# Patient Record
Sex: Male | Born: 1965
Health system: Southern US, Community
[De-identification: ages and names within clinical notes are randomized; demographics above are authoritative.]

## PROBLEM LIST (undated history)

## (undated) DIAGNOSIS — I2510 Atherosclerotic heart disease of native coronary artery without angina pectoris: Secondary | ICD-10-CM

## (undated) DIAGNOSIS — I509 Heart failure, unspecified: Secondary | ICD-10-CM

## (undated) DIAGNOSIS — I10 Essential (primary) hypertension: Secondary | ICD-10-CM

## (undated) DIAGNOSIS — E785 Hyperlipidemia, unspecified: Secondary | ICD-10-CM

## (undated) HISTORY — DX: Heart failure, unspecified: I50.9

## (undated) HISTORY — DX: Hyperlipidemia, unspecified: E78.5

## (undated) HISTORY — DX: Essential (primary) hypertension: I10

## (undated) HISTORY — PX: CYST EXCISION: SHX5701

## (undated) HISTORY — PX: MIDDLE EAR SURGERY: SHX713

---

## 2003-12-18 ENCOUNTER — Ambulatory Visit: Payer: Self-pay | Admitting: Cardiology

## 2004-11-21 ENCOUNTER — Ambulatory Visit: Payer: Self-pay | Admitting: Cardiology

## 2007-01-08 ENCOUNTER — Encounter (INDEPENDENT_AMBULATORY_CARE_PROVIDER_SITE_OTHER): Payer: Self-pay | Admitting: General Surgery

## 2007-01-08 ENCOUNTER — Ambulatory Visit (HOSPITAL_COMMUNITY): Admission: RE | Admit: 2007-01-08 | Discharge: 2007-01-08 | Payer: Self-pay | Admitting: General Surgery

## 2010-06-18 NOTE — Op Note (Signed)
NAMELAFE, CLERK NO.:  [id number]   MEDICAL RECORD NO.:  [id number]          PATIENT TYPE:  AMB   LOCATION:  SDS                          FACILITY:  [facility]   PHYSICIAN:  Gabrielle Dare. Janee Morn, M.D.DATE OF BIRTH:  03-19-1965   DATE OF PROCEDURE:  01/08/2007  DATE OF DISCHARGE:                               OPERATIVE REPORT   PREOPERATIVE DIAGNOSIS:  Mass, left posterior thigh.   POSTOPERATIVE DIAGNOSIS:  Mass, left posterior thigh.   PROCEDURE:  Excision of mass, left posterior thigh.   SURGEON:  Gabrielle Dare. Janee Morn, M.D.   ANESTHESIA:  General.   HISTORY OF PRESENT ILLNESS:  Mr. Devonshire is a 45 year old gentleman who  I evaluated in the office in regards to a mass high on his left  posterior thigh.  It initially had an infected appearance consistent  with an epidermal inclusion cyst, however, it is quite large and  pedunculated so excision was planned.  He completed a course of  antibiotics and all the drainage has stopped, but it is still  uncomfortable.  He presents today for elective excision.   PROCEDURE IN DETAIL:  Informed consent was obtained.  The patient was  identified in the preoperative holding area.  His site was marked.  He  was brought to the operating room.  He received intravenous antibiotics.  He was placed in the lithotomy position.  The area around the mass and  his perineum was prepped and draped in a sterile fashion.  Marcaine  0.25% with epinephrine was injected around the base of the mass.  An  elliptical incision was made to encompass the entire base in case this  mass is of a malignant nature.  Subcutaneous tissues were dissected down  and the mass was removed in its entirety and sent to pathology.  Hemostasis was obtained using Bovie cautery.  Some small flaps were  raised medially and laterally to facilitate closure.  The wound was  copiously irrigated.  The wound was then closed with subcutaneous  tissues approximated with  interrupted 3-0 Vicryl sutures.  The skin was  closed with running 4-0 Monocryl subcuticular stitch with no significant  tension and then Dermabond was applied to complete the closure.  Sponge,  needle, and instrument counts were correct.  The patient tolerated the  procedure well without apparent complication, was taken to the recovery  room in stable condition.      Gabrielle Dare Janee Morn, M.D.  Electronically Signed     BET/MEDQ  D:  01/08/2007  T:  01/08/2007  Job:  [id number]   cc:   Ernestina Penna, M.D.

## 2010-11-11 LAB — BASIC METABOLIC PANEL
BUN: 14
CO2: 27
Calcium: 10
Creatinine, Ser: 1.26
GFR calc Af Amer: >60
GFR calc non Af Amer: >60
Glucose, Bld: 103 — ABNORMAL HIGH
Sodium: 135

## 2010-11-11 LAB — CBC
Hemoglobin: 16.3
MCHC: 34.4
Platelets: 246
RBC: 5.21
RDW: 12.2

## 2012-05-06 ENCOUNTER — Other Ambulatory Visit: Payer: Self-pay | Admitting: *Deleted

## 2012-05-06 MED ORDER — HYDROCHLOROTHIAZIDE 25 MG PO TABS: 25.0000 mg | ORAL_TABLET | Freq: Every day | ORAL | Status: DC

## 2012-06-08 ENCOUNTER — Other Ambulatory Visit: Payer: Self-pay

## 2012-06-08 NOTE — Telephone Encounter (Signed)
Last seen 7/13   Last lipid 11/12

## 2012-06-17 ENCOUNTER — Telehealth: Payer: Self-pay | Admitting: Nurse Practitioner

## 2012-06-18 ENCOUNTER — Telehealth: Payer: Self-pay | Admitting: *Deleted

## 2012-06-18 NOTE — Telephone Encounter (Signed)
Pt aware meds were denied and that he must call to get an appt with wong for further rfs- on his VM

## 2012-06-18 NOTE — Telephone Encounter (Signed)
meds were refused on 06/08/12 by [facility], pt needs to be notified

## 2012-06-21 ENCOUNTER — Telehealth: Payer: Self-pay | Admitting: Family Medicine

## 2012-06-21 NOTE — Telephone Encounter (Signed)
jamie B. Took care of this 06/18/12

## 2012-06-21 NOTE — Telephone Encounter (Signed)
APT MADE 

## 2012-06-22 ENCOUNTER — Ambulatory Visit (INDEPENDENT_AMBULATORY_CARE_PROVIDER_SITE_OTHER): Payer: BC Managed Care – PPO | Admitting: Nurse Practitioner

## 2012-06-22 ENCOUNTER — Encounter: Payer: Self-pay | Admitting: Nurse Practitioner

## 2012-06-22 VITALS — BP 158/103 | HR 79 | Temp 98.2°F | Ht 75.0 in | Wt 284.0 lb

## 2012-06-22 DIAGNOSIS — Z125 Encounter for screening for malignant neoplasm of prostate: Secondary | ICD-10-CM

## 2012-06-22 DIAGNOSIS — I10 Essential (primary) hypertension: Secondary | ICD-10-CM

## 2012-06-22 DIAGNOSIS — E785 Hyperlipidemia, unspecified: Secondary | ICD-10-CM | POA: Insufficient documentation

## 2012-06-22 LAB — PSA: PSA: 0.63 ng/mL (ref <=4.00)

## 2012-06-22 LAB — COMPLETE METABOLIC PANEL WITH GFR
ALT: 31 U/L (ref 0–53)
AST: 21 U/L (ref 0–37)
BUN: 16 mg/dL (ref 6–23)
Creat: 1.14 mg/dL (ref 0.50–1.35)
GFR, Est Non African American: 76 mL/min
Total Bilirubin: 0.6 mg/dL (ref 0.3–1.2)

## 2012-06-22 MED ORDER — SIMVASTATIN 40 MG PO TABS: 40.0000 mg | ORAL_TABLET | Freq: Every day | ORAL | Status: DC

## 2012-06-22 MED ORDER — HYDROCHLOROTHIAZIDE 25 MG PO TABS: 25.0000 mg | ORAL_TABLET | Freq: Every day | ORAL | Status: DC

## 2012-06-22 MED ORDER — RAMIPRIL 10 MG PO CAPS: 10.0000 mg | ORAL_CAPSULE | Freq: Every day | ORAL | Status: DC

## 2012-06-22 NOTE — Patient Instructions (Signed)
Health Maintenance, Males A healthy lifestyle and preventative care can promote health and wellness.  Maintain regular health, dental, and eye exams.  Eat a healthy diet. Foods like vegetables, fruits, whole grains, low-fat dairy products, and lean protein foods contain the nutrients you need without too many calories. Decrease your intake of foods high in solid fats, added sugars, and salt. Get information about a proper diet from your caregiver, if necessary.  Regular physical exercise is one of the most important things you can do for your health. Most adults should get at least 150 minutes of moderate-intensity exercise (any activity that increases your heart rate and causes you to sweat) each week. In addition, most adults need muscle-strengthening exercises on 2 or more days a week.   Maintain a healthy weight. The body mass index (BMI) is a screening tool to identify possible weight problems. It provides an estimate of body fat based on height and weight. Your caregiver can help determine your BMI, and can help you achieve or maintain a healthy weight. For adults 20 years and older:  A BMI below 18.5 is considered underweight.  A BMI of 18.5 to 24.9 is normal.  A BMI of 25 to 29.9 is considered overweight.  A BMI of 30 and above is considered obese.  Maintain normal blood lipids and cholesterol by exercising and minimizing your intake of saturated fat. Eat a balanced diet with plenty of fruits and vegetables. Blood tests for lipids and cholesterol should begin at age 20 and be repeated every 5 years. If your lipid or cholesterol levels are high, you are over 50, or you are a high risk for heart disease, you may need your cholesterol levels checked more frequently.Ongoing high lipid and cholesterol levels should be treated with medicines, if diet and exercise are not effective.  If you smoke, find out from your caregiver how to quit. If you do not use tobacco, do not start.  If you  choose to drink alcohol, do not exceed 2 drinks per day. One drink is considered to be 12 ounces (355 mL) of beer, 5 ounces (148 mL) of wine, or 1.5 ounces (44 mL) of liquor.  Avoid use of street drugs. Do not share needles with anyone. Ask for help if you need support or instructions about stopping the use of drugs.  High blood pressure causes heart disease and increases the risk of stroke. Blood pressure should be checked at least every 1 to 2 years. Ongoing high blood pressure should be treated with medicines if weight loss and exercise are not effective.  If you are 45 to 47 years old, ask your caregiver if you should take aspirin to prevent heart disease.  Diabetes screening involves taking a blood sample to check your fasting blood sugar level. This should be done once every 3 years, after age 45, if you are within normal weight and without risk factors for diabetes. Testing should be considered at a younger age or be carried out more frequently if you are overweight and have at least 1 risk factor for diabetes.  Colorectal cancer can be detected and often prevented. Most routine colorectal cancer screening begins at the age of 50 and continues through age 75. However, your caregiver may recommend screening at an earlier age if you have risk factors for colon cancer. On a yearly basis, your caregiver may provide home test kits to check for hidden blood in the stool. Use of a small camera at the end of a tube,   to directly examine the colon (sigmoidoscopy or colonoscopy), can detect the earliest forms of colorectal cancer. Talk to your caregiver about this at age 50, when routine screening begins. Direct examination of the colon should be repeated every 5 to 10 years through age 75, unless early forms of pre-cancerous polyps or small growths are found.  Hepatitis C blood testing is recommended for all people born from 1945 through 1965 and any individual with known risks for hepatitis C.  Healthy  men should no longer receive prostate-specific antigen (PSA) blood tests as part of routine cancer screening. Consult with your caregiver about prostate cancer screening.  Testicular cancer screening is not recommended for adolescents or adult males who have no symptoms. Screening includes self-exam, caregiver exam, and other screening tests. Consult with your caregiver about any symptoms you have or any concerns you have about testicular cancer.  Practice safe sex. Use condoms and avoid high-risk sexual practices to reduce the spread of sexually transmitted infections (STIs).  Use sunscreen with a sun protection factor (SPF) of 30 or greater. Apply sunscreen liberally and repeatedly throughout the day. You should seek shade when your shadow is shorter than you. Protect yourself by wearing long sleeves, pants, a wide-brimmed hat, and sunglasses year round, whenever you are outdoors.  Notify your caregiver of new moles or changes in moles, especially if there is a change in shape or color. Also notify your caregiver if a mole is larger than the size of a pencil eraser.  A one-time screening for abdominal aortic aneurysm (AAA) and surgical repair of large AAAs by sound wave imaging (ultrasonography) is recommended for ages 65 to 75 years who are current or former smokers.  Stay current with your immunizations. Document Released: 07/19/2007 Document Revised: 04/14/2011 Document Reviewed: 06/17/2010 Exit[employer] Patient Information 2013 [employer].  

## 2012-06-22 NOTE — Progress Notes (Signed)
  Subjective:    Patient ID: Jonathan Barrett, male    DOB: 05-May-1965, 47 y.o.   MRN: [id number]  Hypertension This is a chronic problem. The current episode started more than 1 year ago. The problem is unchanged. The problem is uncontrolled (patient has been out of blood pressure meds for 2 weeks.). Associated symptoms include headaches. Pertinent negatives include no chest pain, neck pain, palpitations, peripheral edema or shortness of breath. There are no associated agents to hypertension. Risk factors for coronary artery disease include dyslipidemia, obesity and post-menopausal state. Past treatments include ACE inhibitors and diuretics. The current treatment provides significant improvement. Compliance problems include diet and exercise.   Hyperlipidemia This is a chronic problem. The current episode started more than 1 year ago. The problem is uncontrolled. Recent lipid tests were reviewed and are high. Exacerbating diseases include obesity. There are no known factors aggravating his hyperlipidemia. Pertinent negatives include no chest pain, leg pain, myalgias or shortness of breath. Current antihyperlipidemic treatment includes statins. The current treatment provides moderate improvement of lipids. Compliance problems include adherence to diet and adherence to exercise.  Risk factors for coronary artery disease include hypertension, obesity and male sex.      Review of Systems  Constitutional: Positive for fatigue.  HENT: Negative for neck pain.   Respiratory: Negative for chest tightness and shortness of breath.   Cardiovascular: Negative for chest pain and palpitations.  Musculoskeletal: Negative for myalgias.  Neurological: Positive for headaches.  All other systems reviewed and are negative.       Objective:   Physical Exam  Vitals reviewed. Constitutional: He is oriented to person, place, and time. He appears well-developed and well-nourished.  HENT:  Head: Normocephalic.  Right  Ear: External ear normal.  Left Ear: External ear normal.  Nose: Nose normal.  Mouth/Throat: Oropharynx is clear and moist.  Eyes: EOM are normal. Pupils are equal, round, and reactive to light.  Neck: Normal range of motion. Neck supple. No thyromegaly present.  Cardiovascular: Normal rate, regular rhythm, normal heart sounds and intact distal pulses.   No murmur heard. Pulmonary/Chest: Effort normal and breath sounds normal. He has no wheezes. He has no rales.  Abdominal: Soft. Bowel sounds are normal.  Musculoskeletal: Normal range of motion.  Neurological: He is alert and oriented to person, place, and time.  Skin: Skin is warm and dry.  Psychiatric: He has a normal mood and affect. His behavior is normal. Judgment and thought content normal.  BP 158/103  Pulse 79  Temp(Src) 98.2 F (36.8 C) (Oral)  Ht 6\' 3"  (1.905 m)  Wt 284 lb (128.822 kg)  BMI 35.5 kg/m2         Assessment & Plan:  1. Hypertension Low NA+ diet - hydrochlorothiazide (HYDRODIURIL) 25 MG tablet; Take 1 tablet (25 mg total) by mouth daily.  Dispense: 30 tablet; Refill: 5 - ramipril (ALTACE) 10 MG capsule; Take 1 capsule (10 mg total) by mouth daily.  Dispense: 30 capsule; Refill: 5 - COMPLETE METABOLIC PANEL WITH GFR  2. Hyperlipidemia Low fat diet and exercise - simvastatin (ZOCOR) 40 MG tablet; Take 1 tablet (40 mg total) by mouth at bedtime.  Dispense: 30 tablet; Refill: 5 - NMR Lipoprofile with Lipids  3. Screening for prostate cancer - PSA  Mary-Margaret Daphine Deutscher, FNP

## 2012-06-25 LAB — NMR LIPOPROFILE WITH LIPIDS
HDL Size: 8.6 nm — ABNORMAL LOW (ref >=9.2)
LDL Particle Number: 2010 nmol/L — ABNORMAL HIGH (ref <1000)
Large VLDL-P: 7 nmol/L — ABNORMAL HIGH (ref <=2.7)

## 2012-06-29 ENCOUNTER — Other Ambulatory Visit: Payer: Self-pay | Admitting: Nurse Practitioner

## 2012-06-29 MED ORDER — ATORVASTATIN CALCIUM 40 MG PO TABS: 40.0000 mg | ORAL_TABLET | Freq: Every day | ORAL | Status: DC

## 2012-06-30 ENCOUNTER — Telehealth: Payer: Self-pay | Admitting: Nurse Practitioner

## 2012-06-30 MED ORDER — RAMIPRIL 10 MG PO CAPS: ORAL_CAPSULE | ORAL | Status: DC

## 2012-06-30 NOTE — Telephone Encounter (Signed)
Pt takes 2 a day of altace for a total of 20mg  a da. Can we send in new rx. And pt aware of labs

## 2012-06-30 NOTE — Telephone Encounter (Signed)
rx sent to pharmacy

## 2012-06-30 NOTE — Telephone Encounter (Signed)
rx sent in- patient aware.  

## 2012-07-06 ENCOUNTER — Ambulatory Visit: Payer: Self-pay | Admitting: General Practice

## 2012-07-12 ENCOUNTER — Ambulatory Visit (INDEPENDENT_AMBULATORY_CARE_PROVIDER_SITE_OTHER): Payer: BC Managed Care – PPO | Admitting: General Practice

## 2012-07-12 ENCOUNTER — Telehealth: Payer: Self-pay | Admitting: Nurse Practitioner

## 2012-07-12 ENCOUNTER — Encounter: Payer: Self-pay | Admitting: General Practice

## 2012-07-12 VITALS — BP 122/86 | HR 107 | Temp 99.3°F | Ht 73.0 in | Wt 280.5 lb

## 2012-07-12 DIAGNOSIS — J029 Acute pharyngitis, unspecified: Secondary | ICD-10-CM

## 2012-07-12 MED ORDER — AMOXICILLIN 500 MG PO CAPS: 500.0000 mg | ORAL_CAPSULE | Freq: Two times a day (BID) | ORAL | Status: DC

## 2012-07-12 NOTE — Patient Instructions (Addendum)

## 2012-07-12 NOTE — Progress Notes (Signed)
  Subjective:    Patient ID: Jonathan Barrett, male    DOB: 1965/05/16, 47 y.o.   MRN: [id number]  HPI Presents today with complaints of sore throat. Reports onset as Saturday. OTC tylenol and ibuprofen for fever 9100.5-102). Rates sore throat as 6-7 on 1-10 pain scale.     Review of Systems  Constitutional: Positive for fever. Negative for chills.  HENT: Positive for sore throat. Negative for ear pain, congestion, sneezing, neck pain, neck stiffness, postnasal drip and sinus pressure.   Eyes: Negative for pain, discharge and itching.  Respiratory: Negative for cough, chest tightness and wheezing.   Cardiovascular: Negative for chest pain and palpitations.  Gastrointestinal: Negative for nausea, vomiting, abdominal pain, diarrhea and blood in stool.  Genitourinary: Negative for difficulty urinating.  Musculoskeletal: Positive for myalgias.  Skin: Negative for rash.  Neurological: Negative for dizziness, syncope, weakness and headaches.  All other systems reviewed and are negative.       Objective:   Physical Exam  Constitutional: He is oriented to person, place, and time. He appears well-developed and well-nourished.  HENT:  Head: Normocephalic and atraumatic.  Right Ear: External ear normal.  Left Ear: External ear normal.  Mouth/Throat: Posterior oropharyngeal erythema present.  Cardiovascular: Normal rate, regular rhythm and normal heart sounds.   Pulmonary/Chest: Effort normal and breath sounds normal. No respiratory distress. He exhibits no tenderness.  Neurological: He is alert and oriented to person, place, and time.  Skin: Skin is warm and dry.  Psychiatric: He has a normal mood and affect.          Assessment & Plan:  1. Sore throat - POCT rapid strep A  2. Acute pharyngitis - amoxicillin (AMOXIL) 500 MG capsule; Take 1 capsule (500 mg total) by mouth 2 (two) times daily.  Dispense: 20 capsule; Refill: 0 -complete full dose of antibiotics Increase fluid  intake Motrin or tylenol OTC OTC decongestant Throat lozenges if help New toothbrush in 3 days Proper handwashing RTO if symptoms worsen or unresolved Patient verbalized understanding Coralie Keens, FNP-C

## 2012-07-12 NOTE — Telephone Encounter (Signed)
Apt made

## 2012-12-20 ENCOUNTER — Other Ambulatory Visit: Payer: Self-pay | Admitting: *Deleted

## 2012-12-20 DIAGNOSIS — I10 Essential (primary) hypertension: Secondary | ICD-10-CM

## 2012-12-20 MED ORDER — HYDROCHLOROTHIAZIDE 25 MG PO TABS: 25.0000 mg | ORAL_TABLET | Freq: Every day | ORAL | Status: DC

## 2012-12-20 NOTE — Telephone Encounter (Signed)
ntbs

## 2013-01-17 ENCOUNTER — Other Ambulatory Visit: Payer: Self-pay

## 2013-01-17 NOTE — Telephone Encounter (Signed)
Last seen 07/12/12  [facility]  Pharmacy requesting a 90 day supply

## 2013-01-17 NOTE — Telephone Encounter (Signed)
Last seen 07/12/12  Jonathan Barrett  Last lipid 06/22/12   Lipitor on EPIC list not simvastatin

## 2013-01-21 ENCOUNTER — Ambulatory Visit: Payer: BC Managed Care – PPO | Admitting: Nurse Practitioner

## 2013-01-21 MED ORDER — RAMIPRIL 10 MG PO CAPS: ORAL_CAPSULE | ORAL | Status: DC

## 2013-01-24 ENCOUNTER — Ambulatory Visit: Payer: BC Managed Care – PPO | Admitting: Family Medicine

## 2013-01-31 ENCOUNTER — Ambulatory Visit (INDEPENDENT_AMBULATORY_CARE_PROVIDER_SITE_OTHER): Payer: BC Managed Care – PPO | Admitting: Family Medicine

## 2013-01-31 ENCOUNTER — Encounter: Payer: Self-pay | Admitting: Family Medicine

## 2013-01-31 VITALS — BP 150/101 | HR 99 | Temp 99.5°F | Ht 73.0 in | Wt 298.0 lb

## 2013-01-31 DIAGNOSIS — L0291 Cutaneous abscess, unspecified: Secondary | ICD-10-CM

## 2013-01-31 DIAGNOSIS — I10 Essential (primary) hypertension: Secondary | ICD-10-CM

## 2013-01-31 MED ORDER — RAMIPRIL 10 MG PO CAPS: ORAL_CAPSULE | ORAL | Status: DC

## 2013-01-31 MED ORDER — SIMVASTATIN 40 MG PO TABS: 40.0000 mg | ORAL_TABLET | Freq: Every day | ORAL | Status: DC

## 2013-01-31 MED ORDER — DOXYCYCLINE HYCLATE 100 MG PO CAPS: 100.0000 mg | ORAL_CAPSULE | Freq: Two times a day (BID) | ORAL | Status: DC

## 2013-01-31 MED ORDER — HYDROCHLOROTHIAZIDE 25 MG PO TABS: 25.0000 mg | ORAL_TABLET | Freq: Every day | ORAL | Status: DC

## 2013-01-31 NOTE — Patient Instructions (Signed)
Abscess An abscess is an infected area that contains a collection of pus and debris.It can occur in almost any part of the body. An abscess is also known as a furuncle or boil. CAUSES  An abscess occurs when tissue gets infected. This can occur from blockage of oil or sweat glands, infection of hair follicles, or a minor injury to the skin. As the body tries to fight the infection, pus collects in the area and creates pressure under the skin. This pressure causes pain. People with weakened immune systems have difficulty fighting infections and get certain abscesses more often.  SYMPTOMS Usually an abscess develops on the skin and becomes a painful mass that is red, warm, and tender. If the abscess forms under the skin, you may feel a moveable soft area under the skin. Some abscesses break open (rupture) on their own, but most will continue to get worse without care. The infection can spread deeper into the body and eventually into the bloodstream, causing you to feel ill.  DIAGNOSIS  Your caregiver will take your medical history and perform a physical exam. A sample of fluid may also be taken from the abscess to determine what is causing your infection. TREATMENT  Your caregiver may prescribe antibiotic medicines to fight the infection. However, taking antibiotics alone usually does not cure an abscess. Your caregiver may need to make a small cut (incision) in the abscess to drain the pus. In some cases, gauze is packed into the abscess to reduce pain and to continue draining the area. HOME CARE INSTRUCTIONS   Only take over-the-counter or prescription medicines for pain, discomfort, or fever as directed by your caregiver.  If you were prescribed antibiotics, take them as directed. Finish them even if you start to feel better.  If gauze is used, follow your caregiver's directions for changing the gauze.  To avoid spreading the infection:  Keep your draining abscess covered with a  bandage.  Wash your hands well.  Do not share personal care items, towels, or whirlpools with others.  Avoid skin contact with others.  Keep your skin and clothes clean around the abscess.  Keep all follow-up appointments as directed by your caregiver. SEEK MEDICAL CARE IF:   You have increased pain, swelling, redness, fluid drainage, or bleeding.  You have muscle aches, chills, or a general ill feeling.  You have a fever. MAKE SURE YOU:   Understand these instructions.  Will watch your condition.  Will get help right away if you are not doing well or get worse. Document Released: 10/30/2004 Document Revised: 07/22/2011 Document Reviewed: 04/04/2011 ExitCare Patient Information 2014 [employer].  

## 2013-02-01 LAB — CBC WITH DIFFERENTIAL
Eos: 1 %
Hemoglobin: 16.2 g/dL (ref 12.6–17.7)
Immature Grans (Abs): 0 10*3/uL (ref 0.0–0.1)
MCH: 30.7 pg (ref 26.6–33.0)
Monocytes: 10 %
Neutrophils Absolute: 11.6 10*3/uL — ABNORMAL HIGH (ref 1.4–7.0)
Neutrophils Relative %: 74 %
RBC: 5.28 x10E6/uL (ref 4.14–5.80)
WBC: 15.6 10*3/uL — ABNORMAL HIGH (ref 3.4–10.8)

## 2013-02-01 LAB — COMPREHENSIVE METABOLIC PANEL
Albumin/Globulin Ratio: 1.5 (ref 1.1–2.5)
Albumin: 4.9 g/dL (ref 3.5–5.5)
Alkaline Phosphatase: 83 IU/L (ref 39–117)
BUN: 16 mg/dL (ref 6–24)
CO2: 22 mmol/L (ref 18–29)
Calcium: 9.8 mg/dL (ref 8.7–10.2)
Chloride: 94 mmol/L — ABNORMAL LOW (ref 97–108)
GFR calc Af Amer: 93 mL/min/{1.73_m2} (ref >59)
GFR calc non Af Amer: 80 mL/min/{1.73_m2} (ref >59)
Globulin, Total: 3.2 g/dL (ref 1.5–4.5)
Glucose: 93 mg/dL (ref 65–99)
Sodium: 137 mmol/L (ref 134–144)
Total Bilirubin: 0.5 mg/dL (ref 0.0–1.2)
Total Protein: 8.1 g/dL (ref 6.0–8.5)

## 2013-02-01 LAB — NMR, LIPOPROFILE
HDL Cholesterol by NMR: 51 mg/dL (ref >=40)
HDL Particle Number: 33.5 umol/L (ref >=30.5)
LDL Size: 20.6 nm (ref >20.5)
LDLC SERPL CALC-MCNC: 107 mg/dL — ABNORMAL HIGH (ref <100)

## 2013-02-02 ENCOUNTER — Ambulatory Visit (INDEPENDENT_AMBULATORY_CARE_PROVIDER_SITE_OTHER): Payer: BC Managed Care – PPO | Admitting: *Deleted

## 2013-02-02 ENCOUNTER — Ambulatory Visit: Payer: BC Managed Care – PPO | Admitting: Family Medicine

## 2013-02-02 VITALS — Temp 100.3°F

## 2013-02-02 DIAGNOSIS — L0291 Cutaneous abscess, unspecified: Secondary | ICD-10-CM

## 2013-02-02 LAB — AEROBIC CULTURE

## 2013-02-02 MED ORDER — CEFTRIAXONE SODIUM 1 G IJ SOLR
1.0000 g | Freq: Once | INTRAMUSCULAR | Status: AC
  Administered 2013-02-02: 1 g via INTRAMUSCULAR

## 2013-02-02 NOTE — Patient Instructions (Signed)
Ceftriaxone injection  What is this medicine?  CEFTRIAXONE (sef try AX one) is a cephalosporin antibiotic. It is used to treat certain kinds of bacterial infections. It will not work for colds, flu, or other viral infections.  This medicine may be used for other purposes; ask your health care provider or pharmacist if you have questions.  COMMON BRAND NAME(S): Rocephin  What should I tell my health care provider before I take this medicine?  They need to know if you have any of these conditions:  -any chronic illness  -bowel disease, like colitis  -both kidney and liver disease  -high bilirubin level in newborn patients  -an unusual or allergic reaction to ceftriaxone, other cephalosporin or penicillin antibiotics, foods, dyes or preservatives  -pregnant or trying to get pregnant  -breast-feeding  How should I use this medicine?  This medicine is injected into a muscle or infused it into a vein. It is usually given in a medical office or clinic. If you are to give this medicine you will be taught how to inject it. Follow instructions carefully. Use your doses at regular intervals. Do not take your medicine more often than directed. Do not skip doses or stop your medicine early even if you feel better. Do not stop taking except on your doctor's advice.  Talk to your pediatrician regarding the use of this medicine in children. Special care may be needed.  Overdosage: If you think you have taken too much of this medicine contact a poison control center or emergency room at once.  NOTE: This medicine is only for you. Do not share this medicine with others.  What if I miss a dose?  If you miss a dose, take it as soon as you can. If it is almost time for your next dose, take only that dose. Do not take double or extra doses.  What may interact with this medicine?  Do not take this medicine with any of the following medications:  -intravenous calcium  This list may not describe all possible interactions. Give your health  care provider a list of all the medicines, herbs, non-prescription drugs, or dietary supplements you use. Also tell them if you smoke, drink alcohol, or use illegal drugs. Some items may interact with your medicine.  What should I watch for while using this medicine?  Tell your doctor or health care professional if your symptoms do not improve or if they get worse.  Do not treat diarrhea with over the counter products. Contact your doctor if you have diarrhea that lasts more than 2 days or if it is severe and watery.  If you are being treated for a sexually transmitted disease, avoid sexual contact until you have finished your treatment. Having sex can infect your sexual partner.  Calcium may bind to this medicine and cause lung or kidney problems. Avoid calcium products while taking this medicine and for 48 hours after taking the last dose of this medicine.  What side effects may I notice from receiving this medicine?  Side effects that you should report to your doctor or health care professional as soon as possible:  -allergic reactions like skin rash, itching or hives, swelling of the face, lips, or tongue  -breathing problems  -fever, chills  -irregular heartbeat  -pain when passing urine  -seizures  -stomach pain, cramps  -unusual bleeding, bruising  -unusually weak or tired  Side effects that usually do not require medical attention (report to your doctor or health care professional   if they continue or are bothersome):  -diarrhea  -dizzy, drowsy  -headache  -nausea, vomiting  -pain, swelling, irritation where injected  -stomach upset  -sweating  This list may not describe all possible side effects. Call your doctor for medical advice about side effects. You may report side effects to FDA at 1-800-FDA-1088.  Where should I keep my medicine?  Keep out of the reach of children.  Store at room temperature below 25 degrees C (77 degrees F). Protect from light. Throw away any unused vials after the expiration  date.  NOTE: This sheet is a summary. It may not cover all possible information. If you have questions about this medicine, talk to your doctor, pharmacist, or health care provider.   2014, [employer]vier/[employer] Standard. (2012-02-02 15:34:57)

## 2013-02-02 NOTE — Progress Notes (Signed)
Consulted with Ander Slade, FNP. Patient reports that pain and pressure has improved. Tylenol has managed pain.    Area of redness has enlarged (3 x 5 in) and it is warm to the touch. Induration is about the same.  Bloody drainage present on dressing. No active drainage from wound.   Area cleaned irrigated with NS and covered with 4x4. Rocephin 1g administered. Appt scheduled for follow-up in two days.  Continue to take Tylenol for pain and fever management. Although the office will be closed tomorrow someone is available by phone if his symptoms worsen.

## 2013-02-04 ENCOUNTER — Ambulatory Visit (INDEPENDENT_AMBULATORY_CARE_PROVIDER_SITE_OTHER): Payer: BC Managed Care – PPO | Admitting: Family Medicine

## 2013-02-04 ENCOUNTER — Ambulatory Visit: Payer: BC Managed Care – PPO | Admitting: Family Medicine

## 2013-02-04 ENCOUNTER — Encounter: Payer: Self-pay | Admitting: Family Medicine

## 2013-02-04 VITALS — BP 162/102 | HR 90 | Temp 97.9°F | Ht 73.0 in | Wt 301.0 lb

## 2013-02-04 DIAGNOSIS — L0291 Cutaneous abscess, unspecified: Secondary | ICD-10-CM

## 2013-02-04 DIAGNOSIS — L0390 Cellulitis, unspecified: Secondary | ICD-10-CM

## 2013-02-04 MED ORDER — CEFTRIAXONE SODIUM 1 G IJ SOLR: 1.0000 g | Freq: Once | INTRAMUSCULAR | Status: DC

## 2013-02-04 NOTE — Progress Notes (Signed)
   Subjective:    Patient ID: Doak Mah, male    DOB: 06/22/1965, 48 y.o.   MRN: [id number]  HPI This 48 y.o. male presents for evaluation of right breast abscess and wound check.   Review of Systems No chest pain, SOB, HA, dizziness, vision change, N/V, diarrhea, constipation, dysuria, urinary urgency or frequency, myalgias, arthralgias or rash.     Objective:   Physical Exam Vital signs noted  Well developed well nourished male.  HEENT - Head atraumatic Normocephalic Respiratory - Lungs CTA bilateral Cardiac - RRR S1 and S2 without murmur Skin - Right lateral pectoralis with erythema and open wound Which is draining serous sanguin drainage.       Assessment & Plan:  Abscess - Plan: cefTRIAXone (ROCEPHIN) injection 1 g Continue doxycycline and follow up prn  Lysbeth Penner FNP

## 2013-03-14 ENCOUNTER — Ambulatory Visit: Payer: BC Managed Care – PPO | Admitting: Family Medicine

## 2013-03-15 ENCOUNTER — Ambulatory Visit: Payer: BC Managed Care – PPO | Admitting: Family Medicine

## 2013-03-17 ENCOUNTER — Other Ambulatory Visit: Payer: Self-pay | Admitting: *Deleted

## 2013-03-17 DIAGNOSIS — I10 Essential (primary) hypertension: Secondary | ICD-10-CM

## 2013-03-17 MED ORDER — HYDROCHLOROTHIAZIDE 25 MG PO TABS: 25.0000 mg | ORAL_TABLET | Freq: Every day | ORAL | Status: DC

## 2013-04-18 ENCOUNTER — Telehealth: Payer: Self-pay | Admitting: Family Medicine

## 2013-04-18 DIAGNOSIS — I10 Essential (primary) hypertension: Secondary | ICD-10-CM

## 2013-04-18 NOTE — Telephone Encounter (Signed)
Jonathan Barrett, please call these in.

## 2013-04-19 MED ORDER — HYDROCHLOROTHIAZIDE 25 MG PO TABS: 25.0000 mg | ORAL_TABLET | Freq: Every day | ORAL | Status: DC

## 2013-04-19 MED ORDER — RAMIPRIL 10 MG PO CAPS: 20.0000 mg | ORAL_CAPSULE | Freq: Every day | ORAL | Status: DC

## 2013-04-19 NOTE — Telephone Encounter (Signed)
Patient advised that he has a one year supply of zocor.  Altace and HCTZ refilled.  Patient aware.

## 2013-04-21 ENCOUNTER — Telehealth: Payer: Self-pay | Admitting: Family Medicine

## 2013-04-21 NOTE — Telephone Encounter (Signed)
appt given for tomorrow with bill 

## 2013-04-22 ENCOUNTER — Ambulatory Visit: Payer: BC Managed Care – PPO | Admitting: Family Medicine

## 2013-08-07 NOTE — Progress Notes (Signed)
   Subjective:    Patient ID: Jonathan Barrett, male    DOB: [birth year], 48 y.o.   MRN: [id number]  HPI VISIT FOR 01/31/13  HPI  This patient complains of a RASH  Location: R axilla   Onset: 1 week   Course: R axillary swelling and redness   Self-treated with: nothing  Improvement with treatment: n/a  History  Itching: no  Tenderness: yes, mild   New medications/antibiotics: no  Pet exposure: no  Recent travel or tropical exposure: no  New soaps, shampoos, detergent, clothing: no  Tick/insect exposure: no  Chemical Exposure: no  Red Flags  Feeling ill: no  Fever: no  Facial/tongue swelling/difficulty breathing: no  Diabetic or immunocompromised: no    Also with elevated BP in setting of pain. No CP, SOB, dizziness, headache.   Review of Systems  All other systems reviewed and are negative.      Objective:   Physical Exam  Constitutional: He appears well-developed and well-nourished.  HENT:  Head: Normocephalic and atraumatic.  Eyes: Conjunctivae are normal. Pupils are equal, round, and reactive to light.  Neck: Normal range of motion.  Cardiovascular: Normal rate and regular rhythm.   Pulmonary/Chest: Effort normal.  Abdominal: Soft.  Skin: Skin is warm.             Assessment & Plan:   Orders Placed This Encounter  Procedures  . Aerobic culture  . TSH  . Comprehensive metabolic panel  . NMR, lipoprofile  . CBC With differential/Platelet  . POCT glycosylated hemoglobin (Hb A1C)   Drainage cultured.  Will place on course of doxy for hidradenitis coverage.  Check risk stratification labs.  Restart BP meds. Discussed CV red flags at length.  Come back in 1-2 weeks for BP recheck.  Follow up as needed.

## 2013-08-12 ENCOUNTER — Ambulatory Visit (INDEPENDENT_AMBULATORY_CARE_PROVIDER_SITE_OTHER): Payer: BC Managed Care – PPO | Admitting: Family Medicine

## 2013-08-12 ENCOUNTER — Encounter: Payer: Self-pay | Admitting: Family Medicine

## 2013-08-12 VITALS — BP 157/101 | HR 100 | Temp 99.1°F | Ht 73.0 in | Wt 285.8 lb

## 2013-08-12 DIAGNOSIS — M109 Gout, unspecified: Secondary | ICD-10-CM

## 2013-08-12 DIAGNOSIS — M10072 Idiopathic gout, left ankle and foot: Secondary | ICD-10-CM

## 2013-08-12 MED ORDER — INDOMETHACIN ER 75 MG PO CPCR: 75.0000 mg | ORAL_CAPSULE | Freq: Two times a day (BID) | ORAL | Status: DC

## 2013-08-12 NOTE — Patient Instructions (Signed)
Low-Purine Diet  Purines are compounds that affect the level of uric acid in your body. A low-purine diet is a diet that is low in purines. Eating a low-purine diet can prevent the level of uric acid in your body from getting too high and causing gout or kidney stones or both.  WHAT DO I NEED TO KNOW ABOUT THIS DIET?  · Choose low-purine foods. Examples of low-purine foods are listed in the next section.  · Drink plenty of fluids, especially water. Fluids can help remove uric acid from your body. Try to drink 8-16 cups (1.9-3.8 L) a day.  · Limit foods high in fat, especially saturated fat, as fat makes it harder for the body to get rid of uric acid. Foods high in saturated fat include pizza, cheese, ice cream, whole milk, fried foods, and gravies. Choose foods that are lower in fat and lean sources of protein. Use olive oil when cooking as it contains healthy fats that are not high in saturated fat.  · Limit alcohol. Alcohol interferes with the elimination of uric acid from your body. If you are having a gout attack, avoid all alcohol.  · Keep in mind that different people's bodies react differently to different foods. You will probably learn over time which foods do or do not affect you. If you discover that a food tends to cause your gout to flare up, avoid eating that food. You can more freely enjoy foods that do not cause problems. If you have any questions about a food item, talk to your dietitian or health care provider.  WHICH FOODS ARE LOW, MODERATE, AND HIGH IN PURINES?  The following is a list of foods that are low, moderate, and high in purines. You can eat any amount of the foods that are low in purines. You may be able to have small amounts of foods that are moderate in purines. Ask your health care provider how much of a food moderate in purines you can have. Avoid foods high in purines.  Grains  · Foods low in purines: Enriched white bread, pasta, rice, cake, cornbread, popcorn.  · Foods moderate in  purines: Whole-grain breads and cereals, wheat germ, bran, oatmeal. Uncooked oatmeal. Dry wheat bran or wheat germ.  · Foods high in purines: Pancakes, French toast, biscuits, muffins.  Vegetables  · Foods low in purines: All vegetables, except those that are moderate in purines.  · Foods moderate in purines: Asparagus, cauliflower, spinach, mushrooms, green peas.  Fruits  · All fruits are low in purines.  Meats and other Protein Foods  · Foods low in purines: Eggs, nuts, peanut butter.  · Foods moderate in purines: 80-90% lean beef, lamb, veal, pork, poultry, fish, eggs, peanut butter, nuts. Crab, lobster, oysters, and shrimp. Cooked dried beans, peas, and lentils.  · Foods high in purines: Anchovies, sardines, herring, mussels, tuna, codfish, scallops, trout, and haddock. Bacon. Organ meats (such as liver or kidney). Tripe. Game meat. Goose. Sweetbreads.  Dairy  · All dairy foods are low in purines. Low-fat and fat-free dairy products are best because they are low in saturated fat.  Beverages  · Drinks low in purines: Water, carbonated beverages, tea, coffee, cocoa.  · Drinks moderate in purines: Soft drinks and other drinks sweetened with high-fructose corn syrup. Juices. To find whether a food or drink is sweetened with high-fructose corn syrup, look at the ingredients list.  · Drinks high in purines: Alcoholic beverages (such as beer).  Condiments  · Foods   low in purines: Salt, herbs, olives, pickles, relishes, vinegar.  · Foods moderate in purines: Butter, margarine, oils, mayonnaise.  Fats and Oils  · Foods low in purines: All types, except gravies and sauces made with meat.  · Foods high in purines: Gravies and sauces made with meat.  Other Foods  · Foods low in purines: Sugars, sweets, gelatin. Cake. Soups made without meat.  · Foods moderate in purines: Meat-based or fish-based soups, broths, or bouillons. Foods and drinks sweetened with high-fructose corn syrup.  · Foods high in purines: High-fat desserts  (such as ice cream, cookies, cakes, pies, doughnuts, and chocolate).  Contact your dietitian for more information on foods that are not listed here.  Document Released: 05/17/2010 Document Revised: 01/25/2013 Document Reviewed: 12/27/2012  [employer]® Patient Information ©2015 [employer]. This information is not intended to replace advice given to you by your health care provider. Make sure you discuss any questions you have with your health care provider.

## 2013-08-12 NOTE — Progress Notes (Signed)
   Subjective:    Patient ID: Jonathan Barrett, male    DOB: 1965-06-07, 48 y.o.   MRN: [id number]  HPI  This 48 y.o. male presents for evaluation of left heel pain that started suddenly and he woke up with it in the am a week ago.   The pain was severe but has settled down and he thinks he has gout.  Review of Systems C/o heel pain No chest pain, SOB, HA, dizziness, vision change, N/V, diarrhea, constipation or rash.     Objective:   Physical Exam Vital signs noted  Well developed well nourished male.  HEENT - Head atraumatic Normocephalic Respiratory - Lungs CTA bilateral Cardiac - RRR S1 and S2 without murmur MS - Left heel with erythema and discomfort w/o stepping off of achilles tendon.      Assessment & Plan:  Acute idiopathic gout of left foot - Plan: indomethacin (INDOCIN SR) 75 MG CR capsule, Uric acid Low purine diet and push po fluids.    Lysbeth Penner FNP

## 2013-08-13 LAB — URIC ACID: Uric Acid: 10.1 mg/dL — ABNORMAL HIGH (ref 3.7–8.6)

## 2013-08-15 ENCOUNTER — Encounter: Payer: Self-pay | Admitting: Family Medicine

## 2013-08-15 ENCOUNTER — Ambulatory Visit (INDEPENDENT_AMBULATORY_CARE_PROVIDER_SITE_OTHER): Payer: BC Managed Care – PPO | Admitting: Family Medicine

## 2013-08-15 ENCOUNTER — Telehealth: Payer: Self-pay | Admitting: *Deleted

## 2013-08-15 VITALS — BP 155/99 | HR 96 | Temp 98.0°F | Ht 73.0 in | Wt 290.0 lb

## 2013-08-15 DIAGNOSIS — I10 Essential (primary) hypertension: Secondary | ICD-10-CM

## 2013-08-15 DIAGNOSIS — E790 Hyperuricemia without signs of inflammatory arthritis and tophaceous disease: Secondary | ICD-10-CM

## 2013-08-15 DIAGNOSIS — R7989 Other specified abnormal findings of blood chemistry: Secondary | ICD-10-CM

## 2013-08-15 MED ORDER — ALLOPURINOL 100 MG PO TABS: 100.0000 mg | ORAL_TABLET | Freq: Every day | ORAL | Status: DC

## 2013-08-15 MED ORDER — NEBIVOLOL HCL 10 MG PO TABS: 10.0000 mg | ORAL_TABLET | Freq: Every day | ORAL | Status: DC

## 2013-08-15 NOTE — Progress Notes (Signed)
   Subjective:    Patient ID: Jonathan Barrett, male    DOB: [birth year], 48 y.o.   MRN: [id number]  HPI This 48 y.o. male presents for evaluation of elevated bp.  He has gout and has recently been tx'd with indocin.  He states his gout flare has stopped.  He had uric acid drawn and it is elevated.  He is checking his bp at home with a monitor and it is running 160/100 consistently.  He is going through seperation and his mother has recently died.   Review of Systems No chest pain, SOB, HA, dizziness, vision change, N/V, diarrhea, constipation, dysuria, urinary urgency or frequency, myalgias, arthralgias or rash.     Objective:   Physical Exam  Vital signs noted  Well developed well nourished male.  HEENT - Head atraumatic Normocephalic                Eyes - PERRLA, Conjuctiva - clear Sclera- Clear EOMI                Ears - EAC's Wnl TM's Wnl Gross Hearing WNL                Nose - Nares patent                 Throat - oropharanx wnl Respiratory - Lungs CTA bilateral Cardiac - RRR S1 and S2 without murmur.  160/100 left arm GI - Abdomen soft Nontender and bowel sounds active x 4 Extremities - No edema. Neuro - Grossly intact.      Assessment & Plan:  Essential hypertension - Plan: nebivolol (BYSTOLIC) 10 MG tablet po qd.  Continue with HCTZ and rampiril.  Follow up in one month.  Discussed losing weight, DASH diet, and exercise.  Hyperuricemia - Plan: allopurinol (ZYLOPRIM) 100 MG tablet Low purine diet and continue indocin for 2 weeks while starting on the allopurinol and follow up in one months.  Follow up in one month  Lysbeth Penner FNP

## 2013-08-15 NOTE — Telephone Encounter (Signed)
[employer] [facility] is suggesting atenolol, metoprolol or carvedilol instead of bystolic.  Will either of these work? Let me know and if not I will try to push bystolic through,                   ?

## 2013-08-16 ENCOUNTER — Other Ambulatory Visit: Payer: Self-pay | Admitting: Family Medicine

## 2013-08-16 MED ORDER — METOPROLOL SUCCINATE ER 50 MG PO TB24: 50.0000 mg | ORAL_TABLET | Freq: Every day | ORAL | Status: DC

## 2013-08-16 NOTE — Telephone Encounter (Signed)
Sent rx of metoprolol and hopefully this will be covered

## 2013-08-24 ENCOUNTER — Encounter: Payer: Self-pay | Admitting: Family Medicine

## 2013-08-24 ENCOUNTER — Ambulatory Visit (INDEPENDENT_AMBULATORY_CARE_PROVIDER_SITE_OTHER): Payer: BC Managed Care – PPO | Admitting: Family Medicine

## 2013-08-24 VITALS — BP 126/85 | HR 86 | Temp 98.4°F | Ht 73.0 in | Wt 290.0 lb

## 2013-08-24 DIAGNOSIS — I10 Essential (primary) hypertension: Secondary | ICD-10-CM

## 2013-08-24 MED ORDER — METOPROLOL SUCCINATE ER 100 MG PO TB24: 100.0000 mg | ORAL_TABLET | Freq: Every day | ORAL | Status: DC

## 2013-08-24 NOTE — Progress Notes (Signed)
   Subjective:    Patient ID: Jonathan Barrett, male    DOB: 05-Oct-1965, 48 y.o.   MRN: [id number]  HPI This 48 y.o. male presents for evaluation of follow up on bp.  He has been doubling his toprol and in the am it is still elevated.   Review of Systems No chest pain, SOB, HA, dizziness, vision change, N/V, diarrhea, constipation, dysuria, urinary urgency or frequency, myalgias, arthralgias or rash.     Objective:   Physical Exam Vital signs noted  Well developed well nourished male.  HEENT - Head atraumatic Normocephalic                Eyes - PERRLA, Conjuctiva - clear Sclera- Clear EOMI                Ears - EAC's Wnl TM's Wnl Gross Hearing WNL                Throat - oropharanx wnl Respiratory - Lungs CTA bilateral Cardiac - RRR S1 and S2 without murmur.  bp right 124/84 left 124/82 GI - Abdomen soft Nontender and bowel sounds active x 4 Extremities - No edema. Neuro - Grossly intact.       Assessment & Plan:  HTN - Increase toprol xl to 100mg  po qd and continue rampiril 10mg  po bid  Lysbeth Penner FNP

## 2013-10-27 ENCOUNTER — Other Ambulatory Visit: Payer: Self-pay | Admitting: Family Medicine

## 2013-11-29 ENCOUNTER — Other Ambulatory Visit: Payer: Self-pay | Admitting: *Deleted

## 2013-11-29 MED ORDER — RAMIPRIL 10 MG PO CAPS: 20.0000 mg | ORAL_CAPSULE | Freq: Every day | ORAL | Status: DC

## 2014-01-25 ENCOUNTER — Other Ambulatory Visit: Payer: Self-pay | Admitting: *Deleted

## 2014-01-25 DIAGNOSIS — I10 Essential (primary) hypertension: Secondary | ICD-10-CM

## 2014-01-25 MED ORDER — HYDROCHLOROTHIAZIDE 25 MG PO TABS: 25.0000 mg | ORAL_TABLET | Freq: Every day | ORAL | Status: DC

## 2014-02-04 ENCOUNTER — Other Ambulatory Visit: Payer: Self-pay | Admitting: Family Medicine

## 2014-02-07 ENCOUNTER — Other Ambulatory Visit: Payer: Self-pay

## 2014-02-07 DIAGNOSIS — I10 Essential (primary) hypertension: Secondary | ICD-10-CM

## 2014-02-21 ENCOUNTER — Other Ambulatory Visit: Payer: Self-pay | Admitting: Family Medicine

## 2014-03-02 ENCOUNTER — Telehealth: Payer: Self-pay | Admitting: Family Medicine

## 2014-03-02 NOTE — Telephone Encounter (Signed)
Appointment given for tomorrow with Jonathan Pates, FNP.

## 2014-03-03 ENCOUNTER — Ambulatory Visit: Payer: Self-pay | Admitting: Family Medicine

## 2014-03-08 ENCOUNTER — Ambulatory Visit (INDEPENDENT_AMBULATORY_CARE_PROVIDER_SITE_OTHER): Payer: BLUE CROSS/BLUE SHIELD | Admitting: Family Medicine

## 2014-03-08 ENCOUNTER — Ambulatory Visit: Payer: BLUE CROSS/BLUE SHIELD | Admitting: Family Medicine

## 2014-03-08 VITALS — Ht 74.5 in | Wt 289.2 lb

## 2014-03-08 DIAGNOSIS — I10 Essential (primary) hypertension: Secondary | ICD-10-CM

## 2014-03-08 DIAGNOSIS — Z024 Encounter for examination for driving license: Secondary | ICD-10-CM

## 2014-03-08 DIAGNOSIS — E785 Hyperlipidemia, unspecified: Secondary | ICD-10-CM

## 2014-03-08 DIAGNOSIS — M10011 Idiopathic gout, right shoulder: Secondary | ICD-10-CM

## 2014-03-08 MED ORDER — INDOMETHACIN ER 75 MG PO CPCR: 75.0000 mg | ORAL_CAPSULE | Freq: Two times a day (BID) | ORAL | Status: DC

## 2014-03-08 MED ORDER — HYDROCHLOROTHIAZIDE 25 MG PO TABS: 25.0000 mg | ORAL_TABLET | Freq: Every day | ORAL | Status: DC

## 2014-03-08 MED ORDER — RAMIPRIL 10 MG PO CAPS: 10.0000 mg | ORAL_CAPSULE | Freq: Two times a day (BID) | ORAL | Status: DC

## 2014-03-08 MED ORDER — METOPROLOL SUCCINATE ER 100 MG PO TB24: 100.0000 mg | ORAL_TABLET | Freq: Every day | ORAL | Status: DC

## 2014-03-08 MED ORDER — SIMVASTATIN 40 MG PO TABS: 40.0000 mg | ORAL_TABLET | Freq: Every day | ORAL | Status: DC

## 2014-03-08 NOTE — Progress Notes (Signed)
   Subjective:    Patient ID: Jonathan Barrett, male    DOB: 19-Oct-1965, 49 y.o.   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  He does not want to take his allopurinol since he has not had anymore gout attacks.  He states he is eating more chicken and rabbit and trying to lose weight.  Review of Systems  Constitutional: Negative for fever.  HENT: Negative for ear pain.   Eyes: Negative for discharge.  Respiratory: Negative for cough.   Cardiovascular: Negative for chest pain.  Gastrointestinal: Negative for abdominal distention.  Endocrine: Negative for polyuria.  Genitourinary: Negative for difficulty urinating.  Musculoskeletal: Negative for gait problem and neck pain.  Skin: Negative for color change and rash.  Neurological: Negative for speech difficulty and headaches.  Psychiatric/Behavioral: Negative for agitation.       Objective:    Ht 6' 2.5" (1.892 m)  Wt 289 lb 3.2 oz (131.18 kg)  BMI 36.65 kg/m2 BP 138/82 Physical Exam  Constitutional: He is oriented to person, place, and time. He appears well-developed and well-nourished.  HENT:  Head: Normocephalic and atraumatic.  Mouth/Throat: Oropharynx is clear and moist.  Eyes: Pupils are equal, round, and reactive to light.  Neck: Normal range of motion. Neck supple.  Cardiovascular: Normal rate and regular rhythm.   No murmur heard. Pulmonary/Chest: Effort normal and breath sounds normal.  Abdominal: Soft. Bowel sounds are normal. There is no tenderness.  Neurological: He is alert and oriented to person, place, and time.  Skin: Skin is warm and dry.  Psychiatric: He has a normal mood and affect.          Assessment & Plan:     ICD-9-CM ICD-10-CM   1. Essential hypertension 401.9 I10 hydrochlorothiazide (HYDRODIURIL) 25 MG tablet     metoprolol succinate (TOPROL-XL) 100 MG 24 hr tablet     ramipril (ALTACE) 10 MG capsule  2. Hyperlipidemia 272.4 E78.5 simvastatin (ZOCOR) 40 MG tablet  3.  Idiopathic gout of right shoulder, unspecified chronicity 274.00 M10.011 indomethacin (INDOCIN SR) 75 MG CR capsule     No Follow-up on file.  Lysbeth Penner FNP

## 2014-04-07 ENCOUNTER — Other Ambulatory Visit: Payer: Self-pay | Admitting: Family Medicine

## 2014-08-14 ENCOUNTER — Other Ambulatory Visit: Payer: Self-pay | Admitting: Family Medicine

## 2014-08-14 NOTE — Telephone Encounter (Signed)
Last seen 03/08/14 [facility] [place]  No upcoming appt

## 2014-08-26 ENCOUNTER — Other Ambulatory Visit: Payer: Self-pay | Admitting: Family

## 2015-01-16 ENCOUNTER — Other Ambulatory Visit: Payer: Self-pay | Admitting: *Deleted

## 2015-01-16 MED ORDER — ALLOPURINOL 100 MG PO TABS: 100.0000 mg | ORAL_TABLET | Freq: Every day | ORAL | Status: DC

## 2015-01-16 NOTE — Telephone Encounter (Signed)
Last seen 03/2014 

## 2015-02-14 ENCOUNTER — Encounter: Payer: Self-pay | Admitting: Family Medicine

## 2015-02-15 ENCOUNTER — Encounter: Payer: Self-pay | Admitting: Family Medicine

## 2015-02-15 ENCOUNTER — Ambulatory Visit (INDEPENDENT_AMBULATORY_CARE_PROVIDER_SITE_OTHER): Payer: BLUE CROSS/BLUE SHIELD | Admitting: Family Medicine

## 2015-02-15 VITALS — BP 144/98 | HR 79 | Temp 98.7°F | Ht 74.5 in | Wt 291.6 lb

## 2015-02-15 DIAGNOSIS — Z1159 Encounter for screening for other viral diseases: Secondary | ICD-10-CM | POA: Diagnosis not present

## 2015-02-15 DIAGNOSIS — Z1211 Encounter for screening for malignant neoplasm of colon: Secondary | ICD-10-CM

## 2015-02-15 DIAGNOSIS — I10 Essential (primary) hypertension: Secondary | ICD-10-CM | POA: Diagnosis not present

## 2015-02-15 DIAGNOSIS — E785 Hyperlipidemia, unspecified: Secondary | ICD-10-CM | POA: Diagnosis not present

## 2015-02-15 MED ORDER — METOPROLOL SUCCINATE ER 100 MG PO TB24: 100.0000 mg | ORAL_TABLET | Freq: Every day | ORAL | Status: DC

## 2015-02-15 MED ORDER — SIMVASTATIN 40 MG PO TABS: 40.0000 mg | ORAL_TABLET | Freq: Every day | ORAL | Status: DC

## 2015-02-15 MED ORDER — RAMIPRIL 10 MG PO CAPS: 10.0000 mg | ORAL_CAPSULE | Freq: Two times a day (BID) | ORAL | Status: DC

## 2015-02-15 MED ORDER — SPIRONOLACTONE 25 MG PO TABS: 25.0000 mg | ORAL_TABLET | Freq: Every day | ORAL | Status: DC

## 2015-02-15 MED ORDER — HYDROCHLOROTHIAZIDE 25 MG PO TABS: 25.0000 mg | ORAL_TABLET | Freq: Every day | ORAL | Status: DC

## 2015-02-15 MED ORDER — ALLOPURINOL 100 MG PO TABS: 100.0000 mg | ORAL_TABLET | Freq: Every day | ORAL | Status: DC

## 2015-02-15 NOTE — Progress Notes (Signed)
BP 144/98 mmHg  Pulse 79  Temp(Src) 98.7 F (37.1 C) (Oral)  Ht 6' 2.5" (1.892 m)  Wt 291 lb 9.6 oz (132.269 kg)  BMI 36.95 kg/m2   Subjective:    Patient ID: Jonathan Barrett, male    DOB: [birth year], 50 y.o.   MRN: [id number]  HPI: Jonathan Barrett is a 50 y.o. male presenting on 02/15/2015 for Hypertension   HPI Hypertension recheck Patient is coming in for a hypertension recheck and he is currently on Toprol 100 mg extended release daily, ramipril 20 mg daily, hydrochlorothiazide 25 mg daily. His blood pressure today is 144/98 and he has been getting consistent numbers that are also elevated at home in the same range and even up into the 150s. Patient denies headaches, blurred vision, chest pains, shortness of breath, or weakness. Denies any side effects from medication and is content with current medication. He denies any sweats or palpitations or problems with hot or cold or constipation or diarrhea.   Hyperlipidemia Patient is currently taking simvastatin 40 mg nightly for his cholesterol. He denies any issues with the simvastatin such as myalgias or liver problems. He is due for recheck on his cholesterol and we will do that today.  Relevant past medical, surgical, family and social history reviewed and updated as indicated. Interim medical history since our last visit reviewed. Allergies and medications reviewed and updated.  Review of Systems  Constitutional: Negative for fever and chills.  HENT: Negative for ear discharge and ear pain.   Eyes: Negative for discharge and visual disturbance.  Respiratory: Negative for shortness of breath and wheezing.   Cardiovascular: Negative for chest pain and leg swelling.  Gastrointestinal: Negative for abdominal pain, diarrhea and constipation.  Genitourinary: Negative for difficulty urinating.  Musculoskeletal: Negative for back pain and gait problem.  Skin: Negative for rash.  Neurological: Negative for dizziness, syncope,  light-headedness and headaches.  All other systems reviewed and are negative.   Per HPI unless specifically indicated above     Medication List       This list is accurate as of: 02/15/15  9:42 AM.  Always use your most recent med list.               allopurinol 100 MG tablet  Commonly known as:  ZYLOPRIM  Take 1 tablet (100 mg total) by mouth daily.     hydrochlorothiazide 25 MG tablet  Commonly known as:  HYDRODIURIL  Take 1 tablet (25 mg total) by mouth daily.     indomethacin 75 MG CR capsule  Commonly known as:  INDOCIN SR  TAKE 1 CAPSULE BY MOUTH TWICE DAILY WITHA MEAL     metoprolol succinate 100 MG 24 hr tablet  Commonly known as:  TOPROL-XL  Take 1 tablet (100 mg total) by mouth daily. Take with or immediately following a meal.     ramipril 10 MG capsule  Commonly known as:  ALTACE  Take 1 capsule (10 mg total) by mouth 2 (two) times daily.     simvastatin 40 MG tablet  Commonly known as:  ZOCOR  Take 1 tablet (40 mg total) by mouth daily at 6 PM.     spironolactone 25 MG tablet  Commonly known as:  ALDACTONE  Take 1 tablet (25 mg total) by mouth daily.           Objective:    BP 144/98 mmHg  Pulse 79  Temp(Src) 98.7 F (37.1 C) (Oral)  Ht 6' 2.5" (1.892 m)  Wt 291 lb 9.6 oz (132.269 kg)  BMI 36.95 kg/m2  Wt Readings from Last 3 Encounters:  02/15/15 291 lb 9.6 oz (132.269 kg)  03/08/14 289 lb 3.2 oz (131.18 kg)  08/24/13 290 lb (131.543 kg)    Physical Exam  Constitutional: He is oriented to person, place, and time. He appears well-developed and well-nourished. No distress.  Eyes: Conjunctivae and EOM are normal. Pupils are equal, round, and reactive to light. Right eye exhibits no discharge. No scleral icterus.  Neck: Neck supple. No thyromegaly present.  Cardiovascular: Normal rate, regular rhythm, normal heart sounds and intact distal pulses.   No murmur heard. Pulmonary/Chest: Effort normal and breath sounds normal. No respiratory  distress. He has no wheezes.  Musculoskeletal: Normal range of motion. He exhibits no edema.  Lymphadenopathy:    He has no cervical adenopathy.  Neurological: He is alert and oriented to person, place, and time. Coordination normal.  Skin: Skin is warm and dry. No rash noted. He is not diaphoretic.  Psychiatric: He has a normal mood and affect. His behavior is normal.  Vitals reviewed.   Results for orders placed or performed in visit on 08/12/13  Uric acid  Result Value Ref Range   Uric Acid 10.1 (H) 3.7 - 8.6 mg/dL      Assessment & Plan:   Problem List Items Addressed This Visit      Cardiovascular and Mediastinum   Hypertension - Primary    Add the spironolactone 25 mg for possible resistant hypertension, patient may need to have the full workup for secondary causes of hypertension if this does not work.      Relevant Medications   spironolactone (ALDACTONE) 25 MG tablet   ramipril (ALTACE) 10 MG capsule   simvastatin (ZOCOR) 40 MG tablet   metoprolol succinate (TOPROL-XL) 100 MG 24 hr tablet   hydrochlorothiazide (HYDRODIURIL) 25 MG tablet   Other Relevant Orders   CMP14+EGFR (Completed)     Other   Hyperlipidemia    Continue medication and check levels today and see how they are.      Relevant Medications   spironolactone (ALDACTONE) 25 MG tablet   ramipril (ALTACE) 10 MG capsule   simvastatin (ZOCOR) 40 MG tablet   metoprolol succinate (TOPROL-XL) 100 MG 24 hr tablet   hydrochlorothiazide (HYDRODIURIL) 25 MG tablet   Other Relevant Orders   Lipid panel (Completed)    Other Visit Diagnoses    Need for hepatitis C screening test        Relevant Orders    Hepatitis C antibody (Completed)    Screen for colon cancer        Relevant Orders    Ambulatory referral to Gastroenterology        Follow up plan: Return in about 2 weeks (around 03/01/2015), or if symptoms worsen or fail to improve, for HTN, HLD, .  Counseling provided for all of the vaccine  components Orders Placed This Encounter  Procedures  . CMP14+EGFR  . Lipid panel  . Hepatitis C antibody  . Ambulatory referral to Gastroenterology    Caryl Pina, MD [facility] 02/15/2015, 9:42 AM

## 2015-02-16 ENCOUNTER — Other Ambulatory Visit: Payer: BLUE CROSS/BLUE SHIELD

## 2015-02-17 LAB — LIPID PANEL
CHOL/HDL RATIO: 3.8 ratio (ref 0.0–5.0)
Cholesterol, Total: 180 mg/dL (ref 100–199)
HDL: 47 mg/dL (ref >39)
LDL CALC: 110 mg/dL — AB (ref 0–99)
Triglycerides: 114 mg/dL (ref 0–149)
VLDL CHOLESTEROL CAL: 23 mg/dL (ref 5–40)

## 2015-02-17 LAB — CMP14+EGFR
ALBUMIN: 4.4 g/dL (ref 3.5–5.5)
ALT: 29 IU/L (ref 0–44)
AST: 21 IU/L (ref 0–40)
Albumin/Globulin Ratio: 1.4 (ref 1.1–2.5)
Alkaline Phosphatase: 60 IU/L (ref 39–117)
BUN / CREAT RATIO: 15 (ref 9–20)
BUN: 17 mg/dL (ref 6–24)
Bilirubin Total: 0.4 mg/dL (ref 0.0–1.2)
CO2: 22 mmol/L (ref 18–29)
CREATININE: 1.12 mg/dL (ref 0.76–1.27)
Calcium: 9.7 mg/dL (ref 8.7–10.2)
Chloride: 100 mmol/L (ref 96–106)
GFR, EST AFRICAN AMERICAN: 88 mL/min/{1.73_m2} (ref >59)
GFR, EST NON AFRICAN AMERICAN: 76 mL/min/{1.73_m2} (ref >59)
GLUCOSE: 99 mg/dL (ref 65–99)
Globulin, Total: 3.1 g/dL (ref 1.5–4.5)
Potassium: 4.8 mmol/L (ref 3.5–5.2)
Sodium: 140 mmol/L (ref 134–144)
TOTAL PROTEIN: 7.5 g/dL (ref 6.0–8.5)

## 2015-02-17 LAB — HEPATITIS C ANTIBODY

## 2015-02-17 NOTE — Assessment & Plan Note (Signed)
Add the spironolactone 25 mg for possible resistant hypertension, patient may need to have the full workup for secondary causes of hypertension if this does not work.

## 2015-02-17 NOTE — Assessment & Plan Note (Signed)
Continue medication and check levels today and see how they are.

## 2015-02-22 ENCOUNTER — Encounter: Payer: Self-pay | Admitting: Nurse Practitioner

## 2015-02-23 ENCOUNTER — Encounter: Payer: Self-pay | Admitting: Nurse Practitioner

## 2015-03-02 ENCOUNTER — Encounter: Payer: Self-pay | Admitting: Family Medicine

## 2015-03-02 ENCOUNTER — Ambulatory Visit (INDEPENDENT_AMBULATORY_CARE_PROVIDER_SITE_OTHER): Payer: BLUE CROSS/BLUE SHIELD | Admitting: Family Medicine

## 2015-03-02 VITALS — BP 169/100 | HR 67 | Temp 97.8°F | Ht 74.5 in | Wt 292.4 lb

## 2015-03-02 DIAGNOSIS — I10 Essential (primary) hypertension: Secondary | ICD-10-CM | POA: Diagnosis not present

## 2015-03-02 MED ORDER — AMLODIPINE BESYLATE 10 MG PO TABS: 10.0000 mg | ORAL_TABLET | Freq: Every day | ORAL | Status: DC

## 2015-03-02 NOTE — Progress Notes (Signed)
BP 169/100 mmHg  Pulse 67  Temp(Src) 97.8 F (36.6 C) (Oral)  Ht 6' 2.5" (1.892 m)  Wt 292 lb 6.4 oz (132.632 kg)  BMI 37.05 kg/m2   Subjective:    Patient ID: Jonathan Barrett, male    DOB: 05/23/1965, 50 y.o.   MRN: [id number]  HPI: Jonathan Barrett is a 50 y.o. male presenting on 03/02/2015 for Hypertension   HPI Hypertension Patient comes in today for a blood pressure recheck. His blood pressure is still elevated at 169/100 today. He said he took all of his blood pressure medications today. He is currently on ramipril and metoprolol and hydrochlorothiazide and then we added spironolactone last time. It does not appear that we get any benefit from the spironolactone. Patient denies headaches, blurred vision, chest pains, shortness of breath, or weakness. Denies any side effects from medication and is content with current medication.   Relevant past medical, surgical, family and social history reviewed and updated as indicated. Interim medical history since our last visit reviewed. Allergies and medications reviewed and updated.  Review of Systems  Constitutional: Negative for fever and chills.  HENT: Negative for congestion, ear discharge and ear pain.   Eyes: Negative for discharge and visual disturbance.  Respiratory: Negative for shortness of breath and wheezing.   Cardiovascular: Negative for chest pain and leg swelling.  Gastrointestinal: Negative for abdominal pain, diarrhea and constipation.  Genitourinary: Negative for difficulty urinating.  Musculoskeletal: Negative for back pain and gait problem.  Skin: Negative for rash.  Neurological: Negative for dizziness, syncope, light-headedness and headaches.  All other systems reviewed and are negative.   Per HPI unless specifically indicated above     Medication List       This list is accurate as of: 03/02/15  4:38 PM.  Always use your most recent med list.               allopurinol 100 MG tablet  Commonly known  as:  ZYLOPRIM  Take 1 tablet (100 mg total) by mouth daily.     amLODipine 10 MG tablet  Commonly known as:  NORVASC  Take 1 tablet (10 mg total) by mouth daily.     hydrochlorothiazide 25 MG tablet  Commonly known as:  HYDRODIURIL  Take 1 tablet (25 mg total) by mouth daily.     indomethacin 75 MG CR capsule  Commonly known as:  INDOCIN SR  TAKE 1 CAPSULE BY MOUTH TWICE DAILY WITHA MEAL     metoprolol succinate 100 MG 24 hr tablet  Commonly known as:  TOPROL-XL  Take 1 tablet (100 mg total) by mouth daily. Take with or immediately following a meal.     ramipril 10 MG capsule  Commonly known as:  ALTACE  Take 1 capsule (10 mg total) by mouth 2 (two) times daily.     simvastatin 40 MG tablet  Commonly known as:  ZOCOR  Take 1 tablet (40 mg total) by mouth daily at 6 PM.     spironolactone 25 MG tablet  Commonly known as:  ALDACTONE  Take 1 tablet (25 mg total) by mouth daily.           Objective:    BP 169/100 mmHg  Pulse 67  Temp(Src) 97.8 F (36.6 C) (Oral)  Ht 6' 2.5" (1.892 m)  Wt 292 lb 6.4 oz (132.632 kg)  BMI 37.05 kg/m2  Wt Readings from Last 3 Encounters:  03/02/15 292 lb 6.4 oz (132.632 kg)  02/15/15 291 lb  9.6 oz (132.269 kg)  03/08/14 289 lb 3.2 oz (131.18 kg)    Physical Exam  Constitutional: He is oriented to person, place, and time. He appears well-developed and well-nourished. No distress.  Eyes: Conjunctivae and EOM are normal. Pupils are equal, round, and reactive to light. Right eye exhibits no discharge. No scleral icterus.  Neck: Neck supple. No thyromegaly present.  Cardiovascular: Normal rate, regular rhythm, normal heart sounds and intact distal pulses.   No murmur heard. Pulmonary/Chest: Effort normal and breath sounds normal. No respiratory distress. He has no wheezes.  Musculoskeletal: Normal range of motion. He exhibits no edema.  Lymphadenopathy:    He has no cervical adenopathy.  Neurological: He is alert and oriented to person,  place, and time. Coordination normal.  Skin: Skin is warm and dry. No rash noted. He is not diaphoretic.  Psychiatric: He has a normal mood and affect. His behavior is normal.  Nursing note and vitals reviewed.   Results for orders placed or performed in visit on 02/15/15  CMP14+EGFR  Result Value Ref Range   Glucose 99 65 - 99 mg/dL   BUN 17 6 - 24 mg/dL   Creatinine, Ser 1.12 0.76 - 1.27 mg/dL   GFR calc non Af Amer 76 >59 mL/min/1.73   GFR calc Af Amer 88 >59 mL/min/1.73   BUN/Creatinine Ratio 15 9 - 20   Sodium 140 134 - 144 mmol/L   Potassium 4.8 3.5 - 5.2 mmol/L   Chloride 100 96 - 106 mmol/L   CO2 22 18 - 29 mmol/L   Calcium 9.7 8.7 - 10.2 mg/dL   Total Protein 7.5 6.0 - 8.5 g/dL   Albumin 4.4 3.5 - 5.5 g/dL   Globulin, Total 3.1 1.5 - 4.5 g/dL   Albumin/Globulin Ratio 1.4 1.1 - 2.5   Bilirubin Total 0.4 0.0 - 1.2 mg/dL   Alkaline Phosphatase 60 39 - 117 IU/L   AST 21 0 - 40 IU/L   ALT 29 0 - 44 IU/L  Lipid panel  Result Value Ref Range   Cholesterol, Total 180 100 - 199 mg/dL   Triglycerides 114 0 - 149 mg/dL   HDL 47 >39 mg/dL   VLDL Cholesterol Cal 23 5 - 40 mg/dL   LDL Calculated 110 (H) 0 - 99 mg/dL   Chol/HDL Ratio 3.8 0.0 - 5.0 ratio units  Hepatitis C antibody  Result Value Ref Range   Hep C Virus Ab <0.1 0.0 - 0.9 s/co ratio      Assessment & Plan:   Problem List Items Addressed This Visit      Cardiovascular and Mediastinum   Hypertension - Primary    We will add Norvasc 10 mg to current regimen. If we do not see changes or benefits from this then he will need to go for a secondary workup.      Relevant Medications   amLODipine (NORVASC) 10 MG tablet       Follow up plan: Return in about 1 month (around 04/02/2015), or if symptoms worsen or fail to improve, for Hypertension recheck.  Counseling provided for all of the vaccine components No orders of the defined types were placed in this encounter.    Caryl Pina, MD [facility] 03/02/2015, 4:38 PM

## 2015-03-05 NOTE — Assessment & Plan Note (Signed)
We will add Norvasc 10 mg to current regimen. If we do not see changes or benefits from this then he will need to go for a secondary workup.

## 2015-03-08 ENCOUNTER — Encounter: Payer: Self-pay | Admitting: Nurse Practitioner

## 2015-03-08 ENCOUNTER — Ambulatory Visit (INDEPENDENT_AMBULATORY_CARE_PROVIDER_SITE_OTHER): Payer: Self-pay | Admitting: Nurse Practitioner

## 2015-03-08 VITALS — BP 112/73 | HR 84 | Temp 98.6°F | Ht 75.0 in | Wt 284.0 lb

## 2015-03-08 DIAGNOSIS — Z024 Encounter for examination for driving license: Secondary | ICD-10-CM

## 2015-03-08 LAB — POCT URINALYSIS DIPSTICK
BILIRUBIN UA: NEGATIVE
Blood, UA: NEGATIVE
GLUCOSE UA: NEGATIVE
Ketones, UA: NEGATIVE
LEUKOCYTES UA: NEGATIVE
NITRITE UA: NEGATIVE
Protein, UA: NEGATIVE
Spec Grav, UA: <=1.005
Urobilinogen, UA: NEGATIVE
pH, UA: 6

## 2015-03-08 NOTE — Progress Notes (Signed)
Patient ID: Landin Engquist, male   DOB: January 20, 1966, 50 y.o.   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

## 2015-03-23 ENCOUNTER — Ambulatory Visit (INDEPENDENT_AMBULATORY_CARE_PROVIDER_SITE_OTHER): Payer: BLUE CROSS/BLUE SHIELD | Admitting: Family Medicine

## 2015-03-23 ENCOUNTER — Encounter: Payer: Self-pay | Admitting: Family Medicine

## 2015-03-23 VITALS — BP 132/94 | HR 84 | Temp 97.5°F | Ht 75.0 in | Wt 287.0 lb

## 2015-03-23 DIAGNOSIS — Z1211 Encounter for screening for malignant neoplasm of colon: Secondary | ICD-10-CM | POA: Diagnosis not present

## 2015-03-23 DIAGNOSIS — I10 Essential (primary) hypertension: Secondary | ICD-10-CM | POA: Diagnosis not present

## 2015-03-23 DIAGNOSIS — E785 Hyperlipidemia, unspecified: Secondary | ICD-10-CM

## 2015-03-23 MED ORDER — HYDROCHLOROTHIAZIDE 25 MG PO TABS: 25.0000 mg | ORAL_TABLET | Freq: Every day | ORAL | Status: DC

## 2015-03-23 MED ORDER — ALLOPURINOL 100 MG PO TABS: 100.0000 mg | ORAL_TABLET | Freq: Every day | ORAL | Status: DC

## 2015-03-23 MED ORDER — AMLODIPINE BESYLATE 10 MG PO TABS: 10.0000 mg | ORAL_TABLET | Freq: Every day | ORAL | Status: DC

## 2015-03-23 MED ORDER — RAMIPRIL 10 MG PO CAPS: 10.0000 mg | ORAL_CAPSULE | Freq: Two times a day (BID) | ORAL | Status: DC

## 2015-03-23 MED ORDER — INDOMETHACIN ER 75 MG PO CPCR: 75.0000 mg | ORAL_CAPSULE | Freq: Two times a day (BID) | ORAL | Status: DC

## 2015-03-23 MED ORDER — SIMVASTATIN 40 MG PO TABS: 40.0000 mg | ORAL_TABLET | Freq: Every day | ORAL | Status: DC

## 2015-03-23 MED ORDER — METOPROLOL SUCCINATE ER 100 MG PO TB24: 100.0000 mg | ORAL_TABLET | Freq: Every day | ORAL | Status: DC

## 2015-03-23 NOTE — Progress Notes (Signed)
BP 132/94 mmHg  Pulse 84  Temp(Src) 97.5 F (36.4 C) (Oral)  Ht 6\' 3"  (1.905 m)  Wt 287 lb (130.182 kg)  BMI 35.87 kg/m2   Subjective:    Patient ID: Jonathan Barrett, male    DOB: 10-26-1965, 50 y.o.   MRN: [id number]  HPI: Jonathan Barrett is a 50 y.o. male presenting on 03/23/2015 for Hypertension   HPI Hypertension recheck Patient comes in today for hypertension recheck. His blood pressure today is 132/94. 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 He does check his blood pressures at home and they have been running more in the 120 130/80 and 70 range. Today in the office is 132/94 at higher than what he usually gets. He is currently on 5 medications to control his blood pressure. We do not feel like the spironolactone helped much or change anything so we will back off on that and see how his blood pressure goes over the next couple weeks. Patient denies headaches, blurred vision, chest pains, shortness of breath, or weakness. Denies any side effects from medication and is content with current medication.   Hyperlipidemia recheck Patient comes in today for cholesterol recheck. He is currently taking simvastatin 40 mg. He denies any side effects from medication. He is due for recheck of his labs.  Relevant past medical, surgical, family and social history reviewed and updated as indicated. Interim medical history since our last visit reviewed. Allergies and medications reviewed and updated.  Review of Systems  Constitutional: Negative for fever.  HENT: Negative for ear discharge and ear pain.   Eyes: Negative for discharge and visual disturbance.  Respiratory: Negative for shortness of breath and wheezing.   Cardiovascular: Negative for chest pain and leg swelling.  Gastrointestinal: Negative for abdominal pain, diarrhea and constipation.  Genitourinary: Negative for difficulty urinating.  Musculoskeletal: Negative for back  pain and gait problem.  Skin: Negative for rash.  Neurological: Negative for dizziness, syncope, light-headedness and headaches.  All other systems reviewed and are negative.   Per HPI unless specifically indicated above     Medication List       This list is accurate as of: 03/23/15  9:27 AM.  Always use your most recent med list.               allopurinol 100 MG tablet  Commonly known as:  ZYLOPRIM  Take 1 tablet (100 mg total) by mouth daily.     amLODipine 10 MG tablet  Commonly known as:  NORVASC  Take 1 tablet (10 mg total) by mouth daily.     hydrochlorothiazide 25 MG tablet  Commonly known as:  HYDRODIURIL  Take 1 tablet (25 mg total) by mouth daily.     indomethacin 75 MG CR capsule  Commonly known as:  INDOCIN SR  Take 1 capsule (75 mg total) by mouth 2 (two) times daily with a meal.     metoprolol succinate 100 MG 24 hr tablet  Commonly known as:  TOPROL-XL  Take 1 tablet (100 mg total) by mouth daily. Take with or immediately following a meal.     ramipril 10 MG capsule  Commonly known as:  ALTACE  Take 1 capsule (10 mg total) by mouth 2 (two) times daily.     simvastatin 40 MG tablet  Commonly known as:  ZOCOR  Take 1 tablet (40 mg total) by mouth daily at 6 PM.  spironolactone 25 MG tablet  Commonly known as:  ALDACTONE  Take 1 tablet (25 mg total) by mouth daily.           Objective:    BP 132/94 mmHg  Pulse 84  Temp(Src) 97.5 F (36.4 C) (Oral)  Ht 6\' 3"  (1.905 m)  Wt 287 lb (130.182 kg)  BMI 35.87 kg/m2  Wt Readings from Last 3 Encounters:  03/23/15 287 lb (130.182 kg)  03/08/15 284 lb (128.822 kg)  03/02/15 292 lb 6.4 oz (132.632 kg)    Physical Exam  Constitutional: He is oriented to person, place, and time. He appears well-developed and well-nourished. No distress.  Eyes: Conjunctivae and EOM are normal. Pupils are equal, round, and reactive to light. Right eye exhibits no discharge. No scleral icterus.  Neck: Neck supple.  No thyromegaly present.  Cardiovascular: Normal rate, regular rhythm, normal heart sounds and intact distal pulses.   No murmur heard. Pulmonary/Chest: Effort normal and breath sounds normal. No respiratory distress. He has no wheezes.  Musculoskeletal: Normal range of motion. He exhibits no edema.  Lymphadenopathy:    He has no cervical adenopathy.  Neurological: He is alert and oriented to person, place, and time. Coordination normal.  Skin: Skin is warm and dry. No rash noted. He is not diaphoretic.  Psychiatric: He has a normal mood and affect. His behavior is normal.  Vitals reviewed.       Assessment & Plan:   Problem List Items Addressed This Visit      Cardiovascular and Mediastinum   Hypertension   Relevant Medications   simvastatin (ZOCOR) 40 MG tablet   ramipril (ALTACE) 10 MG capsule   metoprolol succinate (TOPROL-XL) 100 MG 24 hr tablet   hydrochlorothiazide (HYDRODIURIL) 25 MG tablet   amLODipine (NORVASC) 10 MG tablet     Other   Hyperlipidemia   Relevant Medications   simvastatin (ZOCOR) 40 MG tablet   ramipril (ALTACE) 10 MG capsule   metoprolol succinate (TOPROL-XL) 100 MG 24 hr tablet   hydrochlorothiazide (HYDRODIURIL) 25 MG tablet   amLODipine (NORVASC) 10 MG tablet    Other Visit Diagnoses    Special screening for malignant neoplasms, colon    -  Primary    Relevant Orders    Ambulatory referral to Gastroenterology        Follow up plan: Return in about 3 months (around 06/20/2015), or if symptoms worsen or fail to improve, for Hypertension recheck.  Counseling provided for all of the vaccine components Orders Placed This Encounter  Procedures  . Ambulatory referral to Gastroenterology    Caryl Pina, MD [facility] 03/23/2015, 9:27 AM

## 2015-06-06 ENCOUNTER — Ambulatory Visit: Payer: BLUE CROSS/BLUE SHIELD | Admitting: Family Medicine

## 2016-05-10 ENCOUNTER — Other Ambulatory Visit: Payer: Self-pay | Admitting: Family Medicine

## 2016-05-10 DIAGNOSIS — E785 Hyperlipidemia, unspecified: Secondary | ICD-10-CM

## 2016-05-10 DIAGNOSIS — I10 Essential (primary) hypertension: Secondary | ICD-10-CM

## 2016-05-12 ENCOUNTER — Other Ambulatory Visit: Payer: Self-pay | Admitting: Family Medicine

## 2016-05-13 NOTE — Telephone Encounter (Signed)
Please advise Pt last seen 03/2015

## 2016-05-13 NOTE — Telephone Encounter (Signed)
Go ahead and send one month's refill, have him keep the appointment.

## 2016-05-14 ENCOUNTER — Other Ambulatory Visit: Payer: Self-pay | Admitting: *Deleted

## 2016-05-14 DIAGNOSIS — E785 Hyperlipidemia, unspecified: Secondary | ICD-10-CM

## 2016-05-14 DIAGNOSIS — I10 Essential (primary) hypertension: Secondary | ICD-10-CM

## 2016-05-14 MED ORDER — AMLODIPINE BESYLATE 10 MG PO TABS: 10.0000 mg | ORAL_TABLET | Freq: Every day | ORAL | 0 refills | Status: DC

## 2016-05-14 MED ORDER — ALLOPURINOL 100 MG PO TABS: 100.0000 mg | ORAL_TABLET | Freq: Every day | ORAL | 0 refills | Status: DC

## 2016-05-14 MED ORDER — INDOMETHACIN ER 75 MG PO CPCR: 75.0000 mg | ORAL_CAPSULE | Freq: Two times a day (BID) | ORAL | 0 refills | Status: DC

## 2016-05-14 MED ORDER — METOPROLOL SUCCINATE ER 100 MG PO TB24: 100.0000 mg | ORAL_TABLET | Freq: Every day | ORAL | 0 refills | Status: DC

## 2016-05-14 MED ORDER — SPIRONOLACTONE 25 MG PO TABS: 25.0000 mg | ORAL_TABLET | Freq: Every day | ORAL | 0 refills | Status: DC

## 2016-05-14 MED ORDER — SIMVASTATIN 40 MG PO TABS: 40.0000 mg | ORAL_TABLET | Freq: Every day | ORAL | 0 refills | Status: DC

## 2016-05-14 MED ORDER — HYDROCHLOROTHIAZIDE 25 MG PO TABS: 25.0000 mg | ORAL_TABLET | Freq: Every day | ORAL | 0 refills | Status: DC

## 2016-05-14 MED ORDER — RAMIPRIL 10 MG PO CAPS: 10.0000 mg | ORAL_CAPSULE | Freq: Two times a day (BID) | ORAL | 0 refills | Status: DC

## 2016-05-14 NOTE — Telephone Encounter (Signed)
One refill sent in and patient aware.

## 2016-05-21 ENCOUNTER — Ambulatory Visit: Payer: BLUE CROSS/BLUE SHIELD | Admitting: Family Medicine

## 2016-05-26 ENCOUNTER — Ambulatory Visit (INDEPENDENT_AMBULATORY_CARE_PROVIDER_SITE_OTHER): Payer: BLUE CROSS/BLUE SHIELD | Admitting: Family Medicine

## 2016-05-26 ENCOUNTER — Encounter: Payer: Self-pay | Admitting: Family Medicine

## 2016-05-26 VITALS — BP 127/89 | HR 91 | Temp 98.0°F | Ht 75.0 in | Wt 297.0 lb

## 2016-05-26 DIAGNOSIS — E782 Mixed hyperlipidemia: Secondary | ICD-10-CM | POA: Diagnosis not present

## 2016-05-26 DIAGNOSIS — M109 Gout, unspecified: Secondary | ICD-10-CM | POA: Diagnosis not present

## 2016-05-26 DIAGNOSIS — I10 Essential (primary) hypertension: Secondary | ICD-10-CM | POA: Diagnosis not present

## 2016-05-26 MED ORDER — SIMVASTATIN 40 MG PO TABS: 40.0000 mg | ORAL_TABLET | Freq: Every day | ORAL | 2 refills | Status: DC

## 2016-05-26 MED ORDER — SPIRONOLACTONE 25 MG PO TABS: 25.0000 mg | ORAL_TABLET | Freq: Every day | ORAL | 2 refills | Status: DC

## 2016-05-26 MED ORDER — RAMIPRIL 10 MG PO CAPS: 10.0000 mg | ORAL_CAPSULE | Freq: Two times a day (BID) | ORAL | 2 refills | Status: DC

## 2016-05-26 MED ORDER — AMLODIPINE BESYLATE 10 MG PO TABS: 10.0000 mg | ORAL_TABLET | Freq: Every day | ORAL | 2 refills | Status: DC

## 2016-05-26 MED ORDER — HYDROCHLOROTHIAZIDE 25 MG PO TABS: 25.0000 mg | ORAL_TABLET | Freq: Every day | ORAL | 2 refills | Status: DC

## 2016-05-26 MED ORDER — METOPROLOL SUCCINATE ER 100 MG PO TB24: 100.0000 mg | ORAL_TABLET | Freq: Every day | ORAL | 2 refills | Status: DC

## 2016-05-26 NOTE — Progress Notes (Signed)
BP 127/89   Pulse 91   Temp 98 F (36.7 C) (Oral)   Ht 6\' 3"  (1.905 m)   Wt 297 lb (134.7 kg)   BMI 37.12 kg/m    Subjective:    Patient ID: Jonathan Barrett, male    DOB: [birth year], 51 y.o.   MRN: [id number]  HPI: Jonathan Barrett is a 51 y.o. male presenting on 05/26/2016 for Hyperlipidemia (followup) and Hypertension   HPI Hypertension recheck A she was coming in for hypertension recheck today. His blood pressure is 127/89. He is currently on amlodipine and hydrochlorothiazide and metoprolol and ramipril and spironolactone for his blood pressure. He says that at home his blood pressure has been running higher in the 140s some of the time but he often checks in the evening after his shower. Patient denies headaches, blurred vision, chest pains, shortness of breath, or weakness. Denies any side effects from medication and is content with current medication.   Hyperlipidemia recheck Patient is coming in for cholesterol recheck today. He is currently on simvastatin and has been doing well on it. His last blood check was in January and it was controlled then. He denies any myalgias or liver issues.  Gout recheck Patient has not had any issues with his gout in quite some time and has stopped his allopurinol and indomethacin and has not needed them. He also says that he is changed his diet reduced red meat intake which is helped greatly.  Relevant past medical, surgical, family and social history reviewed and updated as indicated. Interim medical history since our last visit reviewed. Allergies and medications reviewed and updated.  Review of Systems  Constitutional: Negative for chills and fever.  Eyes: Negative for discharge.  Respiratory: Negative for shortness of breath and wheezing.   Cardiovascular: Negative for chest pain and leg swelling.  Musculoskeletal: Negative for arthralgias, back pain and gait problem.  Skin: Negative for rash.  Neurological: Negative for dizziness,  light-headedness and numbness.  All other systems reviewed and are negative.   Per HPI unless specifically indicated above        Objective:    BP 127/89   Pulse 91   Temp 98 F (36.7 C) (Oral)   Ht 6\' 3"  (1.905 m)   Wt 297 lb (134.7 kg)   BMI 37.12 kg/m   Wt Readings from Last 3 Encounters:  05/26/16 297 lb (134.7 kg)  03/23/15 287 lb (130.2 kg)  03/08/15 284 lb (128.8 kg)    Physical Exam  Constitutional: He is oriented to person, place, and time. He appears well-developed and well-nourished. No distress.  Eyes: Conjunctivae are normal. No scleral icterus.  Cardiovascular: Normal rate, regular rhythm, normal heart sounds and intact distal pulses.   No murmur heard. Pulmonary/Chest: Effort normal and breath sounds normal. No respiratory distress. He has no wheezes. He has no rales.  Musculoskeletal: Normal range of motion. He exhibits no edema or tenderness.  Neurological: He is alert and oriented to person, place, and time. Coordination normal.  Skin: Skin is warm and dry. No rash noted. He is not diaphoretic.  Psychiatric: He has a normal mood and affect. His behavior is normal.  Nursing note and vitals reviewed.       Assessment & Plan:   Problem List Items Addressed This Visit      Cardiovascular and Mediastinum   Hypertension - Primary   Relevant Medications   metoprolol succinate (TOPROL-XL) 100 MG 24 hr tablet   ramipril (ALTACE) 10 MG capsule  simvastatin (ZOCOR) 40 MG tablet   spironolactone (ALDACTONE) 25 MG tablet   amLODipine (NORVASC) 10 MG tablet   hydrochlorothiazide (HYDRODIURIL) 25 MG tablet     Other   Hyperlipidemia   Relevant Medications   metoprolol succinate (TOPROL-XL) 100 MG 24 hr tablet   ramipril (ALTACE) 10 MG capsule   simvastatin (ZOCOR) 40 MG tablet   spironolactone (ALDACTONE) 25 MG tablet   amLODipine (NORVASC) 10 MG tablet   hydrochlorothiazide (HYDRODIURIL) 25 MG tablet   Gout    Patient has stopped his allopurinol  because he has not had any issues and decreased his red meat.          Follow up plan: Return in about 6 months (around 11/25/2016), or if symptoms worsen or fail to improve, for Recheck hypertension and cholesterol.  Counseling provided for all of the vaccine components No orders of the defined types were placed in this encounter.   Caryl Pina, MD [facility] 05/26/2016, 8:46 AM

## 2016-05-26 NOTE — Assessment & Plan Note (Signed)
Patient has stopped his allopurinol because he has not had any issues and decreased his red meat.

## 2017-03-12 ENCOUNTER — Ambulatory Visit: Payer: Self-pay | Admitting: Nurse Practitioner

## 2017-03-12 ENCOUNTER — Encounter: Payer: Self-pay | Admitting: Nurse Practitioner

## 2017-03-12 DIAGNOSIS — Z024 Encounter for examination for driving license: Secondary | ICD-10-CM

## 2017-03-12 NOTE — Progress Notes (Signed)
Patient ID: Jonathan Barrett, male   DOB: 07-26-1965, 52 y.o.   MRN: [id number] [facility]

## 2017-04-27 ENCOUNTER — Encounter: Payer: Self-pay | Admitting: Family Medicine

## 2017-04-27 ENCOUNTER — Ambulatory Visit: Payer: Managed Care, Other (non HMO) | Admitting: Family Medicine

## 2017-04-27 VITALS — BP 148/92 | HR 101 | Temp 97.3°F | Ht 75.0 in | Wt 294.0 lb

## 2017-04-27 DIAGNOSIS — S81811A Laceration without foreign body, right lower leg, initial encounter: Secondary | ICD-10-CM | POA: Diagnosis not present

## 2017-04-27 DIAGNOSIS — Z23 Encounter for immunization: Secondary | ICD-10-CM | POA: Diagnosis not present

## 2017-04-27 NOTE — Patient Instructions (Signed)
There is no evidence of infection on your wound today.  Because it has been greater than 24 hours, this was not repaired with stitches.  I have dressed the wound.  I would like you to continue applying a salve like Vaseline or a triple antibiotic ointment that is not Neosporin daily after cleaning it.  Continue to keep it covered.  If you develop any signs or symptoms of infection, which we discussed during your visit today, please seek immediate medical attention.  You got your tetanus shot updated today.   Laceration Care, Adult A laceration is a cut that goes through all layers of the skin. The cut also goes into the tissue that is right under the skin. Some cuts heal on their own. Others need to be closed with stitches (sutures), staples, skin adhesive strips, or wound glue. Taking care of your cut lowers your risk of infection and helps your cut to heal better. How to take care of your cut For stitches or staples:  Keep the wound clean and dry.  If you were given a bandage (dressing), you should change it at least one time per day or as told by your doctor. You should also change it if it gets wet or dirty.  Keep the wound completely dry for the first 24 hours or as told by your doctor. After that time, you may take a shower or a bath. However, make sure that the wound is not soaked in water until after the stitches or staples have been removed.  Clean the wound one time each day or as told by your doctor: ? Wash the wound with soap and water. ? Rinse the wound with water until all of the soap comes off. ? Pat the wound dry with a clean towel. Do not rub the wound.  After you clean the wound, put a thin layer of antibiotic ointment on it as told by your doctor. This ointment: ? Helps to prevent infection. ? Keeps the bandage from sticking to the wound.  Have your stitches or staples removed as told by your doctor. If your doctor used skin adhesive strips:  Keep the wound clean and  dry.  If you were given a bandage, you should change it at least one time per day or as told by your doctor. You should also change it if it gets dirty or wet.  Do not get the skin adhesive strips wet. You can take a shower or a bath, but be careful to keep the wound dry.  If the wound gets wet, pat it dry with a clean towel. Do not rub the wound.  Skin adhesive strips fall off on their own. You can trim the strips as the wound heals. Do not remove any strips that are still stuck to the wound. They will fall off after a while. If your doctor used wound glue:  Try to keep your wound dry, but you may briefly wet it in the shower or bath. Do not soak the wound in water, such as by swimming.  After you take a shower or a bath, gently pat the wound dry with a clean towel. Do not rub the wound.  Do not do any activities that will make you really sweaty until the skin glue has fallen off on its own.  Do not apply liquid, cream, or ointment medicine to your wound while the skin glue is still on.  If you were given a bandage, you should change it at least  one time per day or as told by your doctor. You should also change it if it gets dirty or wet.  If a bandage is placed over the wound, do not let the tape for the bandage touch the skin glue.  Do not pick at the glue. The skin glue usually stays on for 5-10 days. Then, it falls off of the skin. General Instructions  To help prevent scarring, make sure to cover your wound with sunscreen whenever you are outside after stitches are removed, after adhesive strips are removed, or when wound glue stays in place and the wound is healed. Make sure to wear a sunscreen of at least 30 SPF.  Take over-the-counter and prescription medicines only as told by your doctor.  If you were given antibiotic medicine or ointment, take or apply it as told by your doctor. Do not stop using the antibiotic even if your wound is getting better.  Do not scratch or pick  at the wound.  Keep all follow-up visits as told by your doctor. This is important.  Check your wound every day for signs of infection. Watch for: ? Redness, swelling, or pain. ? Fluid, blood, or pus.  Raise (elevate) the injured area above the level of your heart while you are sitting or lying down, if possible. Get help if:  You got a tetanus shot and you have any of these problems at the injection site: ? Swelling. ? Very bad pain. ? Redness. ? Bleeding.  You have a fever.  A wound that was closed breaks open.  You notice a bad smell coming from your wound or your bandage.  You notice something coming out of the wound, such as wood or glass.  Medicine does not help your pain.  You have more redness, swelling, or pain at the site of your wound.  You have fluid, blood, or pus coming from your wound.  You notice a change in the color of your skin near your wound.  You need to change the bandage often because fluid, blood, or pus is coming from the wound.  You start to have a new rash.  You start to have numbness around the wound. Get help right away if:  You have very bad swelling around the wound.  Your pain suddenly gets worse and is very bad.  You notice painful lumps near the wound or on skin that is anywhere on your body.  You have a red streak going away from your wound.  The wound is on your hand or foot and you cannot move a finger or toe like you usually can.  The wound is on your hand or foot and you notice that your fingers or toes look pale or bluish. This information is not intended to replace advice given to you by your health care provider. Make sure you discuss any questions you have with your health care provider. Document Released: 07/09/2007 Document Revised: 06/28/2015 Document Reviewed: 01/16/2014 Elsevier Interactive Patient Education  [employer].

## 2017-04-27 NOTE — Progress Notes (Signed)
Subjective: CC: cut on back of leg PCP: Dettinger, Fransisca Kaufmann, MD NWG:NFAOZ Jonathan Barrett is a 52 y.o. male presenting to clinic today for:  1. Laceration Patient reports that he sustained a laceration along the right lower leg yesterday morning when he was moving his trash can.  He thinks that he was cut by a piece of glass.  Notes that he bled for a couple of hours but that the wound became hemostatic after cleaning and dressing it.  He is not currently on any anticoagulants.  Denies substantial pain, purulence, fevers, chills, nausea, vomiting.  He is not sure when the last time he had his last tetanus shot was.  He has been dressing the wound with gauze and tape and cleaning the wound with mild soap and water.   ROS: Per HPI  No Known Allergies Past Medical History:  Diagnosis Date  . Hyperlipidemia   . Hypertension     Current Outpatient Medications:  .  amLODipine (NORVASC) 10 MG tablet, Take 1 tablet (10 mg total) by mouth daily., Disp: 90 tablet, Rfl: 2 .  hydrochlorothiazide (HYDRODIURIL) 25 MG tablet, Take 1 tablet (25 mg total) by mouth daily., Disp: 90 tablet, Rfl: 2 .  metoprolol succinate (TOPROL-XL) 100 MG 24 hr tablet, Take 1 tablet (100 mg total) by mouth daily. Take with or immediately following a meal., Disp: 90 tablet, Rfl: 2 .  ramipril (ALTACE) 10 MG capsule, Take 1 capsule (10 mg total) by mouth 2 (two) times daily., Disp: 180 capsule, Rfl: 2 .  simvastatin (ZOCOR) 40 MG tablet, Take 1 tablet (40 mg total) by mouth daily at 6 PM., Disp: 90 tablet, Rfl: 2 .  spironolactone (ALDACTONE) 25 MG tablet, Take 1 tablet (25 mg total) by mouth daily., Disp: 90 tablet, Rfl: 2 Social History   Socioeconomic History  . Marital status: Married    Spouse name: Not on file  . Number of children: Not on file  . Years of education: Not on file  . Highest education level: Not on file  Occupational History  . Not on file  Social Needs  . Financial resource strain: Not on file  .  Food insecurity:    Worry: Not on file    Inability: Not on file  . Transportation needs:    Medical: Not on file    Non-medical: Not on file  Tobacco Use  . Smoking status: Never Smoker  . Smokeless tobacco: Never Used  Substance and Sexual Activity  . Alcohol use: Yes    Alcohol/week: 7.2 oz    Types: 12 Cans of beer per week  . Drug use: No  . Sexual activity: Yes  Lifestyle  . Physical activity:    Days per week: Not on file    Minutes per session: Not on file  . Stress: Not on file  Relationships  . Social connections:    Talks on phone: Not on file    Gets together: Not on file    Attends religious service: Not on file    Active member of club or organization: Not on file    Attends meetings of clubs or organizations: Not on file    Relationship status: Not on file  . Intimate partner violence:    Fear of current or ex partner: Not on file    Emotionally abused: Not on file    Physically abused: Not on file    Forced sexual activity: Not on file  Other Topics Concern  . Not  on file  Social History Narrative  . Not on file   Family History  Problem Relation Age of Onset  . Heart disease Father     Objective: Office vital signs reviewed. BP (!) 148/92   Pulse (!) 101   Temp (!) 97.3 F (36.3 C) (Oral)   Ht 6\' 3"  (1.905 m)   Wt 294 lb (133.4 kg)   BMI 36.75 kg/m   Physical Examination:  General: Awake, alert, well nourished, well appearing, No acute distress Extremities: warm, well perfused, No edema, cyanosis or clubbing; +2 pulses bilaterally MSK: normal gait and normal station Skin: 2.25 inch horizontal healing laceration noted along the right lateral calf. Depth less than 8mm. It is hemostatic.  No purulence, induration or fluctuance.  No erythema.  Minimal tenderness to palpation. Neuro: Light touch sensation grossly intact  Assessment/ Plan: 52 y.o. male   1. Laceration of right lower leg, initial encounter Appears to be healing.  It is  hemostatic.  No evidence of infection.  Laceration was cleaned with saline.  Steri-Strips were applied to improve approximation of laceration healing.  Wound was redressed.  Tetanus shot updated today.  Home care instructions were reviewed with the patient.  Handout provided.  Reasons for return and emergent evaluation the emergency department discussed.  Patient was good understanding will follow-up as needed.   Orders Placed This Encounter  Procedures  . Tdap vaccine greater than or equal to 7yo IM      Janora Norlander, DO [place] [phone]

## 2017-06-17 ENCOUNTER — Other Ambulatory Visit: Payer: Self-pay | Admitting: Family Medicine

## 2017-06-17 DIAGNOSIS — I10 Essential (primary) hypertension: Secondary | ICD-10-CM

## 2017-06-17 DIAGNOSIS — E782 Mixed hyperlipidemia: Secondary | ICD-10-CM

## 2017-06-26 ENCOUNTER — Ambulatory Visit: Payer: Managed Care, Other (non HMO) | Admitting: Family Medicine

## 2017-11-02 ENCOUNTER — Telehealth: Payer: Self-pay | Admitting: Family Medicine

## 2017-11-02 DIAGNOSIS — I10 Essential (primary) hypertension: Secondary | ICD-10-CM

## 2017-11-02 DIAGNOSIS — E782 Mixed hyperlipidemia: Secondary | ICD-10-CM

## 2017-11-02 MED ORDER — METOPROLOL SUCCINATE ER 100 MG PO TB24: ORAL_TABLET | ORAL | 0 refills | Status: DC

## 2017-11-02 MED ORDER — RAMIPRIL 10 MG PO CAPS: 10.0000 mg | ORAL_CAPSULE | Freq: Two times a day (BID) | ORAL | 0 refills | Status: DC

## 2017-11-02 MED ORDER — SIMVASTATIN 40 MG PO TABS: ORAL_TABLET | ORAL | 1 refills | Status: DC

## 2017-11-02 MED ORDER — HYDROCHLOROTHIAZIDE 25 MG PO TABS: 25.0000 mg | ORAL_TABLET | Freq: Every day | ORAL | 0 refills | Status: DC

## 2017-11-02 MED ORDER — SPIRONOLACTONE 25 MG PO TABS: 25.0000 mg | ORAL_TABLET | Freq: Every day | ORAL | 0 refills | Status: DC

## 2017-11-02 MED ORDER — AMLODIPINE BESYLATE 10 MG PO TABS: 10.0000 mg | ORAL_TABLET | Freq: Every day | ORAL | 0 refills | Status: DC

## 2017-11-02 NOTE — Telephone Encounter (Signed)
[facility]   Pt is scheduled to see Dr Warrick Parisian 10/18 pt will run out of medicine on 10/2. Can we send refills on  amLODipine (NORVASC) 10 MG tablet    hydrochlorothiazide (HYDRODIURIL) 25 MG tablet    metoprolol succinate (TOPROL-XL) 100 MG 24 hr tablet    ramipril (ALTACE) 10 MG capsule    simvastatin (ZOCOR) 40 MG tablet    spironolactone (ALDACTONE) 25 MG tablet

## 2017-11-02 NOTE — Telephone Encounter (Signed)
Pt aware RF sent on all meds

## 2017-11-20 ENCOUNTER — Ambulatory Visit: Payer: Managed Care, Other (non HMO) | Admitting: Family Medicine

## 2017-11-26 ENCOUNTER — Other Ambulatory Visit: Payer: Self-pay | Admitting: Family Medicine

## 2017-11-26 DIAGNOSIS — I10 Essential (primary) hypertension: Secondary | ICD-10-CM

## 2017-11-30 ENCOUNTER — Encounter: Payer: Self-pay | Admitting: Family Medicine

## 2017-11-30 ENCOUNTER — Ambulatory Visit: Payer: Managed Care, Other (non HMO) | Admitting: Family Medicine

## 2017-11-30 VITALS — BP 135/89 | HR 85 | Temp 97.9°F | Ht 75.0 in | Wt 310.8 lb

## 2017-11-30 DIAGNOSIS — Z23 Encounter for immunization: Secondary | ICD-10-CM

## 2017-11-30 DIAGNOSIS — I10 Essential (primary) hypertension: Secondary | ICD-10-CM

## 2017-11-30 DIAGNOSIS — E782 Mixed hyperlipidemia: Secondary | ICD-10-CM

## 2017-11-30 LAB — CBC WITH DIFFERENTIAL/PLATELET
BASOS ABS: 0.1 10*3/uL (ref 0.0–0.2)
Basos: 1 %
EOS (ABSOLUTE): 0.2 10*3/uL (ref 0.0–0.4)
Eos: 1 %
HEMOGLOBIN: 15.7 g/dL (ref 13.0–17.7)
Hematocrit: 47 % (ref 37.5–51.0)
IMMATURE GRANS (ABS): 0.1 10*3/uL (ref 0.0–0.1)
Immature Granulocytes: 1 %
LYMPHS: 21 %
Lymphocytes Absolute: 2.5 10*3/uL (ref 0.7–3.1)
MCH: 31 pg (ref 26.6–33.0)
MCHC: 33.4 g/dL (ref 31.5–35.7)
MCV: 93 fL (ref 79–97)
MONOCYTES: 10 %
Monocytes Absolute: 1.2 10*3/uL — ABNORMAL HIGH (ref 0.1–0.9)
NEUTROS ABS: 7.8 10*3/uL — AB (ref 1.4–7.0)
Neutrophils: 66 %
PLATELETS: 229 10*3/uL (ref 150–450)
RBC: 5.06 x10E6/uL (ref 4.14–5.80)
RDW: 12.1 % — ABNORMAL LOW (ref 12.3–15.4)
WBC: 11.9 10*3/uL — AB (ref 3.4–10.8)

## 2017-11-30 LAB — CMP14+EGFR
A/G RATIO: 1.5 (ref 1.2–2.2)
ALBUMIN: 4.5 g/dL (ref 3.5–5.5)
ALT: 29 IU/L (ref 0–44)
AST: 22 IU/L (ref 0–40)
Alkaline Phosphatase: 64 IU/L (ref 39–117)
BILIRUBIN TOTAL: 0.4 mg/dL (ref 0.0–1.2)
BUN / CREAT RATIO: 11 (ref 9–20)
BUN: 12 mg/dL (ref 6–24)
CO2: 19 mmol/L — ABNORMAL LOW (ref 20–29)
CREATININE: 1.09 mg/dL (ref 0.76–1.27)
Calcium: 9.8 mg/dL (ref 8.7–10.2)
Chloride: 100 mmol/L (ref 96–106)
GFR calc Af Amer: 90 mL/min/{1.73_m2} (ref >59)
GFR calc non Af Amer: 78 mL/min/{1.73_m2} (ref >59)
Globulin, Total: 3.1 g/dL (ref 1.5–4.5)
Glucose: 88 mg/dL (ref 65–99)
Potassium: 4.4 mmol/L (ref 3.5–5.2)
SODIUM: 134 mmol/L (ref 134–144)
TOTAL PROTEIN: 7.6 g/dL (ref 6.0–8.5)

## 2017-11-30 LAB — LIPID PANEL
CHOL/HDL RATIO: 3.5 ratio (ref 0.0–5.0)
Cholesterol, Total: 170 mg/dL (ref 100–199)
HDL: 49 mg/dL (ref >39)
LDL CALC: 97 mg/dL (ref 0–99)
TRIGLYCERIDES: 122 mg/dL (ref 0–149)
VLDL Cholesterol Cal: 24 mg/dL (ref 5–40)

## 2017-11-30 NOTE — Progress Notes (Signed)
BP 135/89   Pulse 85   Temp 97.9 F (36.6 C) (Oral)   Ht 6' 3"  (1.905 m)   Wt (!) 310 lb 12.8 oz (141 kg)   BMI 38.85 kg/m    Subjective:    Patient ID: Jonathan Barrett, male    DOB: 23-May-1965, 52 y.o.   MRN: [id number]  HPI: Jonathan Barrett is a 52 y.o. male presenting on 11/30/2017 for Blood Pressure Check   HPI Hypertension Patient is currently on amlodipine and hydrochlorothiazide and metoprolol and ramipril and spironolactone, and their blood pressure today is 135/89. Patient denies any lightheadedness or dizziness. Patient denies headaches, blurred vision, chest pains, shortness of breath, or weakness. Denies any side effects from medication and is content with current medication.   Hyperlipidemia Patient is coming in for recheck of his hyperlipidemia. The patient is currently taking simvastatin. They deny any issues with myalgias or history of liver damage from it. They deny any focal numbness or weakness or chest pain.   Relevant past medical, surgical, family and social history reviewed and updated as indicated. Interim medical history since our last visit reviewed. Allergies and medications reviewed and updated.  Review of Systems  Constitutional: Negative for chills and fever.  Eyes: Negative for visual disturbance.  Respiratory: Negative for shortness of breath and wheezing.   Cardiovascular: Negative for chest pain and leg swelling.  Musculoskeletal: Negative for back pain and gait problem.  Skin: Negative for rash.  Neurological: Negative for dizziness, weakness, light-headedness and numbness.  All other systems reviewed and are negative.   Per HPI unless specifically indicated above   Allergies as of 11/30/2017   No Known Allergies     Medication List        Accurate as of 11/30/17  9:35 AM. Always use your most recent med list.          amLODipine 10 MG tablet Commonly known as:  NORVASC TAKE 1 TABLET BY MOUTH EVERY DAY   hydrochlorothiazide 25  MG tablet Commonly known as:  HYDRODIURIL TAKE 1 TABLET BY MOUTH EVERY DAY   metoprolol succinate 100 MG 24 hr tablet Commonly known as:  TOPROL-XL TAKE ONE TABLET BY MOUTH EVERY DAY. TAKEWITH OR IMMEDIATELY FOLLOWINGA MEAL   ramipril 10 MG capsule Commonly known as:  ALTACE TAKE 1 CAPSULE (10 MG TOTAL) BY MOUTH 2 (TWO) TIMES DAILY.   simvastatin 40 MG tablet Commonly known as:  ZOCOR TAKE ONE TABLET BY MOUTH EVERY DAY AT 6PM   spironolactone 25 MG tablet Commonly known as:  ALDACTONE TAKE 1 TABLET BY MOUTH EVERY DAY          Objective:    BP 135/89   Pulse 85   Temp 97.9 F (36.6 C) (Oral)   Ht 6' 3"  (1.905 m)   Wt (!) 310 lb 12.8 oz (141 kg)   BMI 38.85 kg/m   Wt Readings from Last 3 Encounters:  11/30/17 (!) 310 lb 12.8 oz (141 kg)  04/27/17 294 lb (133.4 kg)  05/26/16 297 lb (134.7 kg)    Physical Exam  Constitutional: He is oriented to person, place, and time. He appears well-developed and well-nourished. No distress.  Eyes: Conjunctivae are normal. No scleral icterus.  Neck: Neck supple. No thyromegaly present.  Cardiovascular: Normal rate, regular rhythm, normal heart sounds and intact distal pulses.  No murmur heard. Pulmonary/Chest: Effort normal and breath sounds normal. No respiratory distress. He has no wheezes.  Musculoskeletal: Normal range of motion. He exhibits no edema.  Lymphadenopathy:    He has no cervical adenopathy.  Neurological: He is alert and oriented to person, place, and time. Coordination normal.  Skin: Skin is warm and dry. No rash noted. He is not diaphoretic.  Psychiatric: He has a normal mood and affect. His behavior is normal.  Nursing note and vitals reviewed.       Assessment & Plan:   Problem List Items Addressed This Visit      Cardiovascular and Mediastinum   Hypertension - Primary   Relevant Orders   CBC with Differential/Platelet   CMP14+EGFR     Other   Hyperlipidemia   Relevant Orders   CBC with  Differential/Platelet   Lipid panel   Morbid obesity (HCC)      BMI of 38 with hypertension and cholesterol means he has morbid obesity, discussed weight and patient is going to re-address and change some things to keep on top of it. His blood pressure looks great today and will continue current medications. Follow up plan: Return in about 6 months (around 06/01/2018), or if symptoms worsen or fail to improve.  Counseling provided for all of the vaccine components Orders Placed This Encounter  Procedures  . CBC with Differential/Platelet  . CMP14+EGFR  . Lipid panel    Caryl Pina, MD [facility] 11/30/2017, 9:35 AM

## 2017-12-28 ENCOUNTER — Other Ambulatory Visit: Payer: Self-pay | Admitting: Family Medicine

## 2017-12-28 DIAGNOSIS — I10 Essential (primary) hypertension: Secondary | ICD-10-CM

## 2018-03-12 ENCOUNTER — Ambulatory Visit (INDEPENDENT_AMBULATORY_CARE_PROVIDER_SITE_OTHER): Payer: Self-pay | Admitting: Nurse Practitioner

## 2018-03-12 ENCOUNTER — Encounter: Payer: Self-pay | Admitting: Nurse Practitioner

## 2018-03-12 DIAGNOSIS — Z024 Encounter for examination for driving license: Secondary | ICD-10-CM

## 2018-03-12 LAB — URINALYSIS
Bilirubin, UA: NEGATIVE
Glucose, UA: NEGATIVE
KETONES UA: NEGATIVE
Leukocytes, UA: NEGATIVE
NITRITE UA: NEGATIVE
Protein, UA: NEGATIVE
SPEC GRAV UA: 1.02 (ref 1.005–1.030)
UUROB: 0.2 mg/dL (ref 0.2–1.0)
pH, UA: 6.5 (ref 5.0–7.5)

## 2018-03-12 NOTE — Addendum Note (Signed)
Addended by: Rolena Infante on: 03/12/2018 11:29 AM   Modules accepted: Orders

## 2018-03-12 NOTE — Progress Notes (Signed)
Patient ID: Jonathan Barrett, male   DOB: [birth year], 53 y.o.   MRN: [id number]  Private DOT- see scanned in document

## 2018-04-23 ENCOUNTER — Other Ambulatory Visit: Payer: Self-pay | Admitting: *Deleted

## 2018-04-23 DIAGNOSIS — E782 Mixed hyperlipidemia: Secondary | ICD-10-CM

## 2018-04-23 MED ORDER — SIMVASTATIN 40 MG PO TABS: 40.0000 mg | ORAL_TABLET | Freq: Every day | ORAL | 1 refills | Status: DC

## 2018-06-18 ENCOUNTER — Other Ambulatory Visit: Payer: Self-pay | Admitting: Family Medicine

## 2018-06-18 DIAGNOSIS — E782 Mixed hyperlipidemia: Secondary | ICD-10-CM

## 2018-06-21 ENCOUNTER — Other Ambulatory Visit: Payer: Self-pay | Admitting: Family Medicine

## 2018-06-21 DIAGNOSIS — I10 Essential (primary) hypertension: Secondary | ICD-10-CM

## 2018-07-09 ENCOUNTER — Telehealth: Payer: Self-pay | Admitting: Family Medicine

## 2018-07-14 ENCOUNTER — Other Ambulatory Visit: Payer: Self-pay | Admitting: Family Medicine

## 2018-07-14 DIAGNOSIS — E782 Mixed hyperlipidemia: Secondary | ICD-10-CM

## 2018-07-15 NOTE — Telephone Encounter (Signed)
Dettinger. NTBS 30 days given 06/18/18

## 2018-07-15 NOTE — Telephone Encounter (Signed)
appt made

## 2018-08-05 ENCOUNTER — Other Ambulatory Visit: Payer: Self-pay

## 2018-08-06 ENCOUNTER — Ambulatory Visit: Payer: Managed Care, Other (non HMO) | Admitting: Family Medicine

## 2018-09-27 ENCOUNTER — Other Ambulatory Visit: Payer: Self-pay | Admitting: Family Medicine

## 2018-09-27 DIAGNOSIS — I10 Essential (primary) hypertension: Secondary | ICD-10-CM

## 2018-11-05 ENCOUNTER — Ambulatory Visit (INDEPENDENT_AMBULATORY_CARE_PROVIDER_SITE_OTHER): Payer: Managed Care, Other (non HMO) | Admitting: Family Medicine

## 2018-11-05 ENCOUNTER — Encounter: Payer: Self-pay | Admitting: Family Medicine

## 2018-11-05 VITALS — BP 120/80 | Wt 282.0 lb

## 2018-11-05 DIAGNOSIS — E782 Mixed hyperlipidemia: Secondary | ICD-10-CM

## 2018-11-05 DIAGNOSIS — I10 Essential (primary) hypertension: Secondary | ICD-10-CM | POA: Diagnosis not present

## 2018-11-05 MED ORDER — HYDROCHLOROTHIAZIDE 25 MG PO TABS: 25.0000 mg | ORAL_TABLET | Freq: Every day | ORAL | 3 refills | Status: DC

## 2018-11-05 MED ORDER — AMLODIPINE BESYLATE 10 MG PO TABS: 10.0000 mg | ORAL_TABLET | Freq: Every day | ORAL | 3 refills | Status: DC

## 2018-11-05 MED ORDER — SPIRONOLACTONE 25 MG PO TABS: 25.0000 mg | ORAL_TABLET | Freq: Every day | ORAL | 3 refills | Status: DC

## 2018-11-05 MED ORDER — METOPROLOL SUCCINATE ER 100 MG PO TB24: 100.0000 mg | ORAL_TABLET | Freq: Every day | ORAL | 3 refills | Status: DC

## 2018-11-05 MED ORDER — SIMVASTATIN 40 MG PO TABS: 40.0000 mg | ORAL_TABLET | Freq: Every day | ORAL | 3 refills | Status: DC

## 2018-11-05 MED ORDER — RAMIPRIL 10 MG PO CAPS: 10.0000 mg | ORAL_CAPSULE | Freq: Two times a day (BID) | ORAL | 3 refills | Status: DC

## 2018-11-05 NOTE — Progress Notes (Signed)
Virtual Visit via telephone Note  I connected with Jonathan Barrett on 11/05/18 at 1115 by telephone and verified that I am speaking with the correct person using two identifiers. Jonathan Barrett is currently located at home and no other people are currently with her during visit. The provider, Fransisca Kaufmann Haron Beilke, MD is located in their office at time of visit.  Call ended at 43  I discussed the limitations, risks, security and privacy concerns of performing an evaluation and management service by telephone and the availability of in person appointments. I also discussed with the patient that there may be a patient responsible charge related to this service. The patient expressed understanding and agreed to proceed.  bp 120/80 History and Present Illness: Hypertension Patient is currently on amlodipine and hydrochlorothiazide and metoprolol and ramipril and spironolactone, and their blood pressure today is 120/80. Patient denies any lightheadedness or dizziness. Patient denies headaches, blurred vision, chest pains, shortness of breath, or weakness. Denies any side effects from medication and is content with current medication.   Hyperlipidemia Patient is coming in for recheck of his hyperlipidemia. The patient is currently taking simvastatin. They deny any issues with myalgias or history of liver damage from it. They deny any focal numbness or weakness or chest pain.   Patient is doing low carb and wraps for lunch. He is using high fiber low carb diet. Patient is not gaining but is not losing like he would like to be.   No diagnosis found.  Outpatient Encounter Medications as of 11/05/2018  Medication Sig  . amLODipine (NORVASC) 10 MG tablet TAKE 1 TABLET BY MOUTH EVERY DAY  . hydrochlorothiazide (HYDRODIURIL) 25 MG tablet TAKE 1 TABLET BY MOUTH EVERY DAY  . metoprolol succinate (TOPROL-XL) 100 MG 24 hr tablet TAKE ONE TABLET BY MOUTH EVERY DAY. TAKEWITH OR IMMEDIATELY FOLLOWINGA MEAL  .  ramipril (ALTACE) 10 MG capsule TAKE 1 CAPSULE (10 MG TOTAL) BY MOUTH 2 (TWO) TIMES DAILY.  . simvastatin (ZOCOR) 40 MG tablet Take 1 tablet (40 mg total) by mouth daily at 6 PM. Needs to be seen for future refills  . spironolactone (ALDACTONE) 25 MG tablet TAKE 1 TABLET BY MOUTH EVERY DAY   No facility-administered encounter medications on file as of 11/05/2018.     Review of Systems  Constitutional: Negative for chills and fever.  Eyes: Negative for visual disturbance.  Respiratory: Negative for shortness of breath and wheezing.   Cardiovascular: Negative for chest pain and leg swelling.  Musculoskeletal: Negative for back pain and gait problem.  Skin: Negative for rash.  Neurological: Negative for dizziness, weakness and light-headedness.  All other systems reviewed and are negative.   Observations/Objective: Patient sounds comfortable and in no acute distress  Assessment and Plan: Problem List Items Addressed This Visit      Cardiovascular and Mediastinum   Hypertension - Primary   Relevant Medications   amLODipine (NORVASC) 10 MG tablet   hydrochlorothiazide (HYDRODIURIL) 25 MG tablet   metoprolol succinate (TOPROL-XL) 100 MG 24 hr tablet   ramipril (ALTACE) 10 MG capsule   spironolactone (ALDACTONE) 25 MG tablet   simvastatin (ZOCOR) 40 MG tablet   Other Relevant Orders   CBC with Differential/Platelet   CMP14+EGFR     Other   Hyperlipidemia   Relevant Medications   amLODipine (NORVASC) 10 MG tablet   hydrochlorothiazide (HYDRODIURIL) 25 MG tablet   metoprolol succinate (TOPROL-XL) 100 MG 24 hr tablet   ramipril (ALTACE) 10 MG capsule   spironolactone (ALDACTONE)  25 MG tablet   simvastatin (ZOCOR) 40 MG tablet   Other Relevant Orders   Lipid panel       Follow Up Instructions: 6 month recheck   Continue current blood pressure and cholesterol medication, patient is working on weight loss and it sounds like he is doing well and he will continue with that.  I  discussed the assessment and treatment plan with the patient. The patient was provided an opportunity to ask questions and all were answered. The patient agreed with the plan and demonstrated an understanding of the instructions.   The patient was advised to call back or seek an in-person evaluation if the symptoms worsen or if the condition fails to improve as anticipated.  The above assessment and management plan was discussed with the patient. The patient verbalized understanding of and has agreed to the management plan. Patient is aware to call the clinic if symptoms persist or worsen. Patient is aware when to return to the clinic for a follow-up visit. Patient educated on when it is appropriate to go to the emergency department.    I provided 15 minutes of non-face-to-face time during this encounter.    Joshua A Dettinger, MD    

## 2018-11-08 ENCOUNTER — Ambulatory Visit: Payer: Managed Care, Other (non HMO) | Admitting: Family Medicine

## 2019-03-21 ENCOUNTER — Other Ambulatory Visit: Payer: Self-pay

## 2019-03-22 ENCOUNTER — Ambulatory Visit (INDEPENDENT_AMBULATORY_CARE_PROVIDER_SITE_OTHER): Payer: Managed Care, Other (non HMO) | Admitting: Nurse Practitioner

## 2019-03-22 ENCOUNTER — Encounter: Payer: Self-pay | Admitting: Nurse Practitioner

## 2019-03-22 VITALS — BP 123/74 | HR 57 | Temp 97.9°F | Ht 75.0 in | Wt 320.0 lb

## 2019-03-22 DIAGNOSIS — Z024 Encounter for examination for driving license: Secondary | ICD-10-CM

## 2019-03-22 DIAGNOSIS — Z0289 Encounter for other administrative examinations: Secondary | ICD-10-CM

## 2019-03-22 NOTE — Progress Notes (Signed)
Patient ID: Jonathan Barrett, male   DOB: 09/01/1965, 54 y.o.   MRN: [id number]  DOT physical- see scanned in documantation

## 2019-03-23 LAB — URINALYSIS
Bilirubin, UA: NEGATIVE
Glucose, UA: NEGATIVE
Ketones, UA: NEGATIVE
Leukocytes,UA: NEGATIVE
Nitrite, UA: NEGATIVE
Protein,UA: NEGATIVE
Specific Gravity, UA: 1.02 (ref 1.005–1.030)
Urobilinogen, Ur: 0.2 mg/dL (ref 0.2–1.0)
pH, UA: 6 (ref 5.0–7.5)

## 2019-04-06 ENCOUNTER — Encounter: Payer: Self-pay | Admitting: *Deleted

## 2019-05-13 ENCOUNTER — Ambulatory Visit: Payer: Self-pay | Admitting: Family Medicine

## 2019-05-27 ENCOUNTER — Telehealth: Payer: Self-pay | Admitting: Family Medicine

## 2019-06-08 ENCOUNTER — Ambulatory Visit: Payer: Self-pay | Admitting: Family Medicine

## 2019-12-12 ENCOUNTER — Other Ambulatory Visit: Payer: Self-pay | Admitting: Family Medicine

## 2019-12-12 DIAGNOSIS — I10 Essential (primary) hypertension: Secondary | ICD-10-CM

## 2019-12-12 DIAGNOSIS — E782 Mixed hyperlipidemia: Secondary | ICD-10-CM

## 2020-01-12 ENCOUNTER — Other Ambulatory Visit: Payer: Self-pay | Admitting: Family Medicine

## 2020-01-12 DIAGNOSIS — I10 Essential (primary) hypertension: Secondary | ICD-10-CM

## 2020-01-12 DIAGNOSIS — E782 Mixed hyperlipidemia: Secondary | ICD-10-CM

## 2020-01-12 MED ORDER — SIMVASTATIN 40 MG PO TABS: 40.0000 mg | ORAL_TABLET | Freq: Every day | ORAL | 0 refills | Status: DC

## 2020-01-12 MED ORDER — RAMIPRIL 10 MG PO CAPS: 10.0000 mg | ORAL_CAPSULE | Freq: Two times a day (BID) | ORAL | 0 refills | Status: DC

## 2020-01-12 MED ORDER — HYDROCHLOROTHIAZIDE 25 MG PO TABS: 25.0000 mg | ORAL_TABLET | Freq: Every day | ORAL | 0 refills | Status: DC

## 2020-01-12 MED ORDER — AMLODIPINE BESYLATE 10 MG PO TABS: 10.0000 mg | ORAL_TABLET | Freq: Every day | ORAL | 0 refills | Status: DC

## 2020-01-12 MED ORDER — METOPROLOL SUCCINATE ER 100 MG PO TB24: 100.0000 mg | ORAL_TABLET | Freq: Every day | ORAL | 0 refills | Status: DC

## 2020-01-12 NOTE — Telephone Encounter (Signed)
Needs to make an appointment.

## 2020-01-12 NOTE — Telephone Encounter (Signed)
Appointment scheduled and rx sent until appointment

## 2020-01-26 ENCOUNTER — Other Ambulatory Visit: Payer: Self-pay | Admitting: Family Medicine

## 2020-01-26 DIAGNOSIS — I10 Essential (primary) hypertension: Secondary | ICD-10-CM

## 2020-02-01 ENCOUNTER — Ambulatory Visit (INDEPENDENT_AMBULATORY_CARE_PROVIDER_SITE_OTHER): Payer: Managed Care, Other (non HMO) | Admitting: Cardiology

## 2020-02-01 ENCOUNTER — Encounter: Payer: Self-pay | Admitting: Cardiology

## 2020-02-01 ENCOUNTER — Other Ambulatory Visit: Payer: Self-pay

## 2020-02-01 VITALS — BP 152/74 | HR 86 | Ht 74.0 in | Wt 331.0 lb

## 2020-02-01 DIAGNOSIS — I10 Essential (primary) hypertension: Secondary | ICD-10-CM | POA: Diagnosis not present

## 2020-02-01 DIAGNOSIS — R079 Chest pain, unspecified: Secondary | ICD-10-CM

## 2020-02-01 DIAGNOSIS — R0683 Snoring: Secondary | ICD-10-CM

## 2020-02-01 DIAGNOSIS — Z01812 Encounter for preprocedural laboratory examination: Secondary | ICD-10-CM | POA: Diagnosis not present

## 2020-02-01 LAB — COMPREHENSIVE METABOLIC PANEL
ALT: 31 IU/L (ref 0–44)
AST: 19 IU/L (ref 0–40)
Albumin/Globulin Ratio: 1.6 (ref 1.2–2.2)
Albumin: 4.5 g/dL (ref 3.8–4.9)
Alkaline Phosphatase: 63 IU/L (ref 44–121)
BUN/Creatinine Ratio: 18 (ref 9–20)
BUN: 17 mg/dL (ref 6–24)
Bilirubin Total: 0.2 mg/dL (ref 0.0–1.2)
CO2: 21 mmol/L (ref 20–29)
Calcium: 10.5 mg/dL — ABNORMAL HIGH (ref 8.7–10.2)
Chloride: 98 mmol/L (ref 96–106)
Creatinine, Ser: 0.93 mg/dL (ref 0.76–1.27)
GFR calc Af Amer: 107 mL/min/{1.73_m2} (ref >59)
GFR calc non Af Amer: 93 mL/min/{1.73_m2} (ref >59)
Globulin, Total: 2.9 g/dL (ref 1.5–4.5)
Glucose: 105 mg/dL — ABNORMAL HIGH (ref 65–99)
Potassium: 4.4 mmol/L (ref 3.5–5.2)
Sodium: 136 mmol/L (ref 134–144)
Total Protein: 7.4 g/dL (ref 6.0–8.5)

## 2020-02-01 LAB — TSH: TSH: 0.896 u[IU]/mL (ref 0.450–4.500)

## 2020-02-01 LAB — LIPID PANEL
Chol/HDL Ratio: 3.9 ratio (ref 0.0–5.0)
Cholesterol, Total: 170 mg/dL (ref 100–199)
HDL: 44 mg/dL (ref >39)
LDL Chol Calc (NIH): 107 mg/dL — ABNORMAL HIGH (ref 0–99)
Triglycerides: 102 mg/dL (ref 0–149)
VLDL Cholesterol Cal: 19 mg/dL (ref 5–40)

## 2020-02-01 LAB — CBC
Hematocrit: 45.4 % (ref 37.5–51.0)
Hemoglobin: 15.3 g/dL (ref 13.0–17.7)
MCH: 31.2 pg (ref 26.6–33.0)
MCHC: 33.7 g/dL (ref 31.5–35.7)
MCV: 93 fL (ref 79–97)
Platelets: 209 10*3/uL (ref 150–450)
RBC: 4.9 x10E6/uL (ref 4.14–5.80)
RDW: 12.2 % (ref 11.6–15.4)
WBC: 12 10*3/uL — ABNORMAL HIGH (ref 3.4–10.8)

## 2020-02-01 MED ORDER — ASPIRIN EC 81 MG PO TBEC: 81.0000 mg | DELAYED_RELEASE_TABLET | Freq: Every day | ORAL | 3 refills | Status: AC

## 2020-02-01 MED ORDER — NITROGLYCERIN 0.4 MG SL SUBL: 0.4000 mg | SUBLINGUAL_TABLET | SUBLINGUAL | 3 refills | Status: DC | PRN

## 2020-02-01 NOTE — H&P (View-Only) (Signed)
Cardiology Office Note:    Date:  02/01/2020   ID:  Jonathan Barrett, DOB [birth year], MRN [id number]  PCP:  Dettinger, Fransisca Kaufmann, MD  Cardiologist:  No primary care provider on file.  Electrophysiologist:  None   Referring MD: Dettinger, Fransisca Kaufmann, MD   Chief Complaint  Patient presents with  . Chest Pain    History of Present Illness:    Jonathan Barrett is a 54 y.o. male with a hx of hypertension, hyperlipidemia who presents for an initial evaluation of chest pain.  He reports that his chest pain started in the last 2 months.  Reports occurring about twice per week.  Describes tightness in center chest and shoulders that occurs with significant exertion.  Also associated with shortness of breath.  Typically last for few minutes and resolves with rest.  Always occurs with exertion, reports most recent episode occurred when he was carrying Christmas presents.  Reports he has gained 60 pounds in the last several years.  Does report he snores.  He denies any lightheadedness, syncope, palpitations, or lower extremity edema.  No smoking history.  Family history includes father died of MI at age 67 and mother died of MI at age 27.   Wt Readings from Last 3 Encounters:  02/01/20 (!) 331 lb (150.1 kg)  03/22/19 (!) 320 lb (145.2 kg)  11/05/18 282 lb (127.9 kg)     Past Medical History:  Diagnosis Date  . Hyperlipidemia   . Hypertension     Past Surgical History:  Procedure Laterality Date  . CYST EXCISION    . MIDDLE EAR SURGERY      Current Medications: Current Meds  Medication Sig  . amLODipine (NORVASC) 10 MG tablet Take 1 tablet (10 mg total) by mouth daily. (Needs to be seen before next refill)  . aspirin EC 81 MG tablet Take 1 tablet (81 mg total) by mouth daily. Swallow whole.  . hydrochlorothiazide (HYDRODIURIL) 25 MG tablet Take 1 tablet (25 mg total) by mouth daily. (Needs to be seen before next refill)  . metoprolol succinate (TOPROL-XL) 100 MG 24 hr tablet Take 1 tablet  (100 mg total) by mouth daily. Take with or immediately following a meal. (Needs to be seen before next refill)  . nitroGLYCERIN (NITROSTAT) 0.4 MG SL tablet Place 1 tablet (0.4 mg total) under the tongue every 5 (five) minutes as needed for chest pain.  . ramipril (ALTACE) 10 MG capsule Take 1 capsule (10 mg total) by mouth 2 (two) times daily. (Needs to be seen before next refill)  . simvastatin (ZOCOR) 40 MG tablet Take 1 tablet (40 mg total) by mouth daily at 6 PM. (Needs to be seen before next refill)  . spironolactone (ALDACTONE) 25 MG tablet Take 1 tablet (25 mg total) by mouth daily.     Allergies:   Patient has no known allergies.   Social History   Socioeconomic History  . Marital status: Married    Spouse name: Not on file  . Number of children: Not on file  . Years of education: Not on file  . Highest education level: Not on file  Occupational History  . Not on file  Tobacco Use  . Smoking status: Never Smoker  . Smokeless tobacco: Never Used  Substance and Sexual Activity  . Alcohol use: Yes    Alcohol/week: 12.0 standard drinks    Types: 12 Cans of beer per week  . Drug use: No  . Sexual activity: Yes  Other Topics Concern  .  Not on file  Social History Narrative  . Not on file   Social Determinants of Health   Financial Resource Strain: Not on file  Food Insecurity: Not on file  Transportation Needs: Not on file  Physical Activity: Not on file  Stress: Not on file  Social Connections: Not on file     Family History: The patient's family history includes Heart disease in his father.  ROS:   Please see the history of present illness.     All other systems reviewed and are negative.  EKGs/Labs/Other Studies Reviewed:    The following studies were reviewed today:   EKG:  EKG is ordered today.  The ekg ordered today demonstrates normal sinus rhythm, rate 86, no ST/T abnormalities  Recent Labs: No results found for requested labs within last 8760  hours.  Recent Lipid Panel    Component Value Date/Time   CHOL 170 11/30/2017 0937   CHOL 212 (H) 06/22/2012 0946   TRIG 122 11/30/2017 0937   TRIG 174 (H) 01/31/2013 1216   TRIG 136 06/22/2012 0946   HDL 49 11/30/2017 0937   HDL 51 01/31/2013 1216   HDL 49 06/22/2012 0946   CHOLHDL 3.5 11/30/2017 0937   LDLCALC 97 11/30/2017 0937   LDLCALC 107 (H) 01/31/2013 1216   LDLCALC 136 (H) 06/22/2012 0946    Physical Exam:    VS:  BP (!) 152/74   Pulse 86   Ht 6\' 2"  (1.88 m)   Wt (!) 331 lb (150.1 kg)   SpO2 95%   BMI 42.50 kg/m     Wt Readings from Last 3 Encounters:  02/01/20 (!) 331 lb (150.1 kg)  03/22/19 (!) 320 lb (145.2 kg)  11/05/18 282 lb (127.9 kg)     GEN:  in no acute distress HEENT: Normal NECK: No JVD; No carotid bruits CARDIAC: RRR, no murmurs, rubs, gallops RESPIRATORY:  Clear to auscultation without rales, wheezing or rhonchi  ABDOMEN: Soft, non-tender, non-distended MUSCULOSKELETAL:  No edema; No deformity  SKIN: Warm and dry NEUROLOGIC:  Alert and oriented x 3 PSYCHIATRIC:  Normal affect   ASSESSMENT:    1. Chest pain of uncertain etiology   2. Essential hypertension   3. Snoring   4. Pre-procedure lab exam    PLAN:    Chest pain: Description consistent with typical angina, as describes chest tightness with significant exertion.  Does have CAD risk factors (hypertension, hyperlipidemia, family history).  He is not a good coronary CTA candidate given obesity and heart rate in the 80s despite Toprol-XL 100 mg daily.  Recommend definitive evaluation with cardiac catheterization, scheduled for 02/13/2020 with Dr. 04/12/2020. -Risks and benefits of cardiac catheterization have been discussed with the patient.  These include bleeding, infection, kidney damage, stroke, heart attack, death.  The patient understands these risks and is willing to proceed. -Echocardiogram -SL NTG as needed -ASA 81 mg daily  Hypertension: On amlodipine 10 mg daily,  hydrochlorothiazide 25 mg daily, Toprol-XL 100 mg daily, ramipril 10 mg twice daily, spironolactone 25 mg daily.  Suspect untreated OSA contributing to resistant hypertension - Sleep study - Will check CMET, TSH, renin/aldosterone - TTE  Hyperlipidemia: On simvastatin 40 mg daily.  Last LDL 97 11/30/17.  Will check lipid panel.  Snoring: Sleep study as above  RTC in 1 month   Medication Adjustments/Labs and Tests Ordered: Current medicines are reviewed at length with the patient today.  Concerns regarding medicines are outlined above.  Orders Placed This Encounter  Procedures  .  Comprehensive metabolic panel  . Lipid panel  . CBC  . TSH  . Aldosterone + renin activity w/ ratio  . EKG 12-Lead  . ECHOCARDIOGRAM COMPLETE  . Split night study   Meds ordered this encounter  Medications  . aspirin EC 81 MG tablet    Sig: Take 1 tablet (81 mg total) by mouth daily. Swallow whole.    Dispense:  90 tablet    Refill:  3  . nitroGLYCERIN (NITROSTAT) 0.4 MG SL tablet    Sig: Place 1 tablet (0.4 mg total) under the tongue every 5 (five) minutes as needed for chest pain.    Dispense:  90 tablet    Refill:  3    Patient Instructions  Medication Instructions:  START: ASPIRIN 81mg  DAILY NITROGLYCERIN- SUBLINGUAL TABLETS- PLEASE SEE INFORMATION SHEET ATTACHED FOR INSTRUCTIONS *If you need a refill on your cardiac medications before your next appointment, please call your pharmacy*  Lab Work: CMET, CBC, TSH, LIPID, RENIN/ALDOSTERONE LEVELS- TODAY COVID TEST- Friday JAN 7th at 1:05pm and [place], [place]  If you have labs (blood work) drawn today and your tests are completely normal, you will receive your results only by: Marland Kitchen MyChart Message (if you have MyChart) OR . A paper copy in the mail If you have any lab test that is abnormal or we need to change your treatment, we will call you to review the results.  Testing/Procedures: Your physician has requested that you have  an echocardiogram. Echocardiography is a painless test that uses sound waves to create images of your heart. It provides your doctor with information about the size and shape of your heart and how well your heart's chambers and valves are working. You may receive an ultrasound enhancing agent through an IV if needed to better visualize your heart during the echo.This procedure takes approximately one hour. There are no restrictions for this procedure. This will take place at the [address].   Your physician has recommended that you have a sleep study. This test records several body functions during sleep, including: brain activity, eye movement, oxygen and carbon dioxide blood levels, heart rate and rhythm, breathing rate and rhythm, the flow of air through your mouth and nose, snoring, body muscle movements, and chest and belly movement. SOMEONE WILL CONTACT YOU TO SCHEDULE THIS   LEFT HEART CATH- PLEASE SEE INFORMATION SHEET ATTACHED.   Follow-Up: At [facility], you and your health needs are our priority.  As part of our continuing mission to provide you with exceptional heart care, we have created designated Provider Care Teams.  These Care Teams include your primary Cardiologist (physician) and Advanced Practice Providers (APPs -  Physician Assistants and Nurse Practitioners) who all work together to provide you with the care you need, when you need it.  Your next appointment:   2 week(s) FOLLOWING HEART CATH  The format for your next appointment:   In Person  Provider:   Oswaldo Milian, MD  Other Instructions   [place] [address] Dept: [phone] Loc: [place]  02/01/2020  You are scheduled for a Cardiac Catheterization on Monday, January 10 with Dr. Peter Martinique.  1. Please arrive at the [facility] (Main Entrance  A) at [facility]: [address] [place], [place] [address] at 5:30 AM (This time is two hours before your procedure to ensure your preparation).  Free valet parking service is available.   Special note: Every effort is made to have your procedure done on time. Please understand that emergencies sometimes delay scheduled procedures.  2. Diet: Do not eat solid foods after midnight.  The patient may have clear liquids until 5am upon the day of the procedure.  3. Labs: You will need to have blood drawn on Wednesday, December 29 at [facility] [address], [place]  Open: 8am - 5pm (Lunch 12:30 - 1:30)   Phone: [phone]. You do not need to be fasting.  4. Medication instructions in preparation for your procedure:  On the morning of your procedure, take your Aspirin and any morning medicines NOT listed above.  You may use sips of water.  5. Plan for one night stay--bring personal belongings. 6. Bring a current list of your medications and current insurance cards. 7. You MUST have a responsible person to drive you home. 8. Someone MUST be with you the first 24 hours after you arrive home or your discharge will be delayed. 9. Please wear clothes that are easy to get on and off and wear slip-on shoes.  Thank you for allowing us to care for you!   -- [facility]   Nitroglycerin sublingual tablets What is this medicine? NITROGLYCERIN (nye troe GLI ser in) is a type of vasodilator. It relaxes blood vessels, increasing the blood and oxygen supply to your heart. This medicine is used to relieve chest pain caused by angina. It is also used to prevent chest pain before activities like climbing stairs, going outdoors in cold weather, or sexual activity. This medicine may be used for other purposes; ask your health care provider or pharmacist if you have questions. COMMON BRAND NAME(S): Nitroquick, Nitrostat, Nitrotab What should I tell my health  care provider before I take this medicine? They need to know if you have any of these conditions:  anemia  head injury, recent stroke, or bleeding in the brain  liver disease  previous heart attack  an unusual or allergic reaction to nitroglycerin, other medicines, foods, dyes, or preservatives  pregnant or trying to get pregnant  breast-feeding How should I use this medicine? Take this medicine by mouth as needed. At the first sign of an angina attack (chest pain or tightness) place one tablet under your tongue. You can also take this medicine 5 to 10 minutes before an event likely to produce chest pain. Follow the directions on the prescription label. Let the tablet dissolve under the tongue. Do not swallow whole. Replace the dose if you accidentally swallow it. It will help if your mouth is not dry. Saliva around the tablet will help it to dissolve more quickly. Do not eat or drink, smoke or chew tobacco while a tablet is dissolving. If you are not better within 5 minutes after taking ONE dose of nitroglycerin, call 9-1-1 immediately to seek emergency medical care. Do not take more than 3 nitroglycerin tablets over 15 minutes. If you take this medicine often to relieve symptoms of angina, your doctor or health care professional may provide you with different instructions to manage your symptoms. If symptoms do not go away after following these instructions, it is important to call 9-1-1 immediately. Do not take more than 3 nitroglycerin tablets over 15 minutes. Talk to your pediatrician regarding the use of this medicine in children. Special care may be needed. Overdosage: If you think you have taken too much of this medicine contact a   poison control center or emergency room at once. NOTE: This medicine is only for you. Do not share this medicine with others. What if I miss a dose? This does not apply. This medicine is only used as needed. What may interact with this medicine? Do not take  this medicine with any of the following medications:  certain migraine medicines like ergotamine and dihydroergotamine (DHE)  medicines used to treat erectile dysfunction like sildenafil, tadalafil, and vardenafil  riociguat This medicine may also interact with the following medications:  alteplase  aspirin  heparin  medicines for high blood pressure  medicines for mental depression  other medicines used to treat angina  phenothiazines like chlorpromazine, mesoridazine, prochlorperazine, thioridazine This list may not describe all possible interactions. Give your health care provider a list of all the medicines, herbs, non-prescription drugs, or dietary supplements you use. Also tell them if you smoke, drink alcohol, or use illegal drugs. Some items may interact with your medicine. What should I watch for while using this medicine? Tell your doctor or health care professional if you feel your medicine is no longer working. Keep this medicine with you at all times. Sit or lie down when you take your medicine to prevent falling if you feel dizzy or faint after using it. Try to remain calm. This will help you to feel better faster. If you feel dizzy, take several deep breaths and lie down with your feet propped up, or bend forward with your head resting between your knees. You may get drowsy or dizzy. Do not drive, use machinery, or do anything that needs mental alertness until you know how this drug affects you. Do not stand or sit up quickly, especially if you are an older patient. This reduces the risk of dizzy or fainting spells. Alcohol can make you more drowsy and dizzy. Avoid alcoholic drinks. Do not treat yourself for coughs, colds, or pain while you are taking this medicine without asking your doctor or health care professional for advice. Some ingredients may increase your blood pressure. What side effects may I notice from receiving this medicine? Side effects that you should  report to your doctor or health care professional as soon as possible:  blurred vision  dry mouth  skin rash  sweating  the feeling of extreme pressure in the head  unusually weak or tired Side effects that usually do not require medical attention (report to your doctor or health care professional if they continue or are bothersome):  flushing of the face or neck  headache  irregular heartbeat, palpitations  nausea, vomiting This list may not describe all possible side effects. Call your doctor for medical advice about side effects. You may report side effects to FDA at 1-800-FDA-1088. Where should I keep my medicine? Keep out of the reach of children. Store at room temperature between 20 and 25 degrees C (68 and 77 degrees F). Store in [employer]. Protect from light and moisture. Keep tightly closed. Throw away any unused medicine after the expiration date. NOTE: This sheet is a summary. It may not cover all possible information. If you have questions about this medicine, talk to your doctor, pharmacist, or health care provider.  2020 Elsevier/Gold Standard (2012-11-18 17:57:36)      Signed, Donato Heinz, MD  02/01/2020 9:24 AM    [facility]

## 2020-02-01 NOTE — Progress Notes (Signed)
Cardiology Office Note:    Date:  02/01/2020   ID:  Jonathan Barrett, DOB 10/02/1965, MRN [id number]  PCP:  Dettinger, Fransisca Kaufmann, MD  Cardiologist:  No primary care provider on file.  Electrophysiologist:  None   Referring MD: Dettinger, Fransisca Kaufmann, MD   Chief Complaint  Patient presents with  . Chest Pain    History of Present Illness:    Jonathan Barrett is a 54 y.o. male with a hx of hypertension, hyperlipidemia who presents for an initial evaluation of chest pain.  He reports that his chest pain started in the last 2 months.  Reports occurring about twice per week.  Describes tightness in center chest and shoulders that occurs with significant exertion.  Also associated with shortness of breath.  Typically last for few minutes and resolves with rest.  Always occurs with exertion, reports most recent episode occurred when he was carrying Christmas presents.  Reports he has gained 60 pounds in the last several years.  Does report he snores.  He denies any lightheadedness, syncope, palpitations, or lower extremity edema.  No smoking history.  Family history includes father died of MI at age 36 and mother died of MI at age 27.   Wt Readings from Last 3 Encounters:  02/01/20 (!) 331 lb (150.1 kg)  03/22/19 (!) 320 lb (145.2 kg)  11/05/18 282 lb (127.9 kg)     Past Medical History:  Diagnosis Date  . Hyperlipidemia   . Hypertension     Past Surgical History:  Procedure Laterality Date  . CYST EXCISION    . MIDDLE EAR SURGERY      Current Medications: Current Meds  Medication Sig  . amLODipine (NORVASC) 10 MG tablet Take 1 tablet (10 mg total) by mouth daily. (Needs to be seen before next refill)  . aspirin EC 81 MG tablet Take 1 tablet (81 mg total) by mouth daily. Swallow whole.  . hydrochlorothiazide (HYDRODIURIL) 25 MG tablet Take 1 tablet (25 mg total) by mouth daily. (Needs to be seen before next refill)  . metoprolol succinate (TOPROL-XL) 100 MG 24 hr tablet Take 1 tablet  (100 mg total) by mouth daily. Take with or immediately following a meal. (Needs to be seen before next refill)  . nitroGLYCERIN (NITROSTAT) 0.4 MG SL tablet Place 1 tablet (0.4 mg total) under the tongue every 5 (five) minutes as needed for chest pain.  . ramipril (ALTACE) 10 MG capsule Take 1 capsule (10 mg total) by mouth 2 (two) times daily. (Needs to be seen before next refill)  . simvastatin (ZOCOR) 40 MG tablet Take 1 tablet (40 mg total) by mouth daily at 6 PM. (Needs to be seen before next refill)  . spironolactone (ALDACTONE) 25 MG tablet Take 1 tablet (25 mg total) by mouth daily.     Allergies:   Patient has no known allergies.   Social History   Socioeconomic History  . Marital status: Married    Spouse name: Not on file  . Number of children: Not on file  . Years of education: Not on file  . Highest education level: Not on file  Occupational History  . Not on file  Tobacco Use  . Smoking status: Never Smoker  . Smokeless tobacco: Never Used  Substance and Sexual Activity  . Alcohol use: Yes    Alcohol/week: 12.0 standard drinks    Types: 12 Cans of beer per week  . Drug use: No  . Sexual activity: Yes  Other Topics Concern  .  Not on file  Social History Narrative  . Not on file   Social Determinants of Health   Financial Resource Strain: Not on file  Food Insecurity: Not on file  Transportation Needs: Not on file  Physical Activity: Not on file  Stress: Not on file  Social Connections: Not on file     Family History: The patient's family history includes Heart disease in his father.  ROS:   Please see the history of present illness.     All other systems reviewed and are negative.  EKGs/Labs/Other Studies Reviewed:    The following studies were reviewed today:   EKG:  EKG is ordered today.  The ekg ordered today demonstrates normal sinus rhythm, rate 86, no ST/T abnormalities  Recent Labs: No results found for requested labs within last 8760  hours.  Recent Lipid Panel    Component Value Date/Time   CHOL 170 11/30/2017 0937   CHOL 212 (H) 06/22/2012 0946   TRIG 122 11/30/2017 0937   TRIG 174 (H) 01/31/2013 1216   TRIG 136 06/22/2012 0946   HDL 49 11/30/2017 0937   HDL 51 01/31/2013 1216   HDL 49 06/22/2012 0946   CHOLHDL 3.5 11/30/2017 0937   LDLCALC 97 11/30/2017 0937   LDLCALC 107 (H) 01/31/2013 1216   LDLCALC 136 (H) 06/22/2012 0946    Physical Exam:    VS:  BP (!) 152/74   Pulse 86   Ht 6\' 2"  (1.88 m)   Wt (!) 331 lb (150.1 kg)   SpO2 95%   BMI 42.50 kg/m     Wt Readings from Last 3 Encounters:  02/01/20 (!) 331 lb (150.1 kg)  03/22/19 (!) 320 lb (145.2 kg)  11/05/18 282 lb (127.9 kg)     GEN:  in no acute distress HEENT: Normal NECK: No JVD; No carotid bruits CARDIAC: RRR, no murmurs, rubs, gallops RESPIRATORY:  Clear to auscultation without rales, wheezing or rhonchi  ABDOMEN: Soft, non-tender, non-distended MUSCULOSKELETAL:  No edema; No deformity  SKIN: Warm and dry NEUROLOGIC:  Alert and oriented x 3 PSYCHIATRIC:  Normal affect   ASSESSMENT:    1. Chest pain of uncertain etiology   2. Essential hypertension   3. Snoring   4. Pre-procedure lab exam    PLAN:    Chest pain: Description consistent with typical angina, as describes chest tightness with significant exertion.  Does have CAD risk factors (hypertension, hyperlipidemia, family history).  He is not a good coronary CTA candidate given obesity and heart rate in the 80s despite Toprol-XL 100 mg daily.  Recommend definitive evaluation with cardiac catheterization, scheduled for 02/13/2020 with Dr. 04/12/2020. -Risks and benefits of cardiac catheterization have been discussed with the patient.  These include bleeding, infection, kidney damage, stroke, heart attack, death.  The patient understands these risks and is willing to proceed. -Echocardiogram -SL NTG as needed -ASA 81 mg daily  Hypertension: On amlodipine 10 mg daily,  hydrochlorothiazide 25 mg daily, Toprol-XL 100 mg daily, ramipril 10 mg twice daily, spironolactone 25 mg daily.  Suspect untreated OSA contributing to resistant hypertension - Sleep study - Will check CMET, TSH, renin/aldosterone - TTE  Hyperlipidemia: On simvastatin 40 mg daily.  Last LDL 97 11/30/17.  Will check lipid panel.  Snoring: Sleep study as above  RTC in 1 month   Medication Adjustments/Labs and Tests Ordered: Current medicines are reviewed at length with the patient today.  Concerns regarding medicines are outlined above.  Orders Placed This Encounter  Procedures  .  Comprehensive metabolic panel  . Lipid panel  . CBC  . TSH  . Aldosterone + renin activity w/ ratio  . EKG 12-Lead  . ECHOCARDIOGRAM COMPLETE  . Split night study   Meds ordered this encounter  Medications  . aspirin EC 81 MG tablet    Sig: Take 1 tablet (81 mg total) by mouth daily. Swallow whole.    Dispense:  90 tablet    Refill:  3  . nitroGLYCERIN (NITROSTAT) 0.4 MG SL tablet    Sig: Place 1 tablet (0.4 mg total) under the tongue every 5 (five) minutes as needed for chest pain.    Dispense:  90 tablet    Refill:  3    Patient Instructions  Medication Instructions:  START: ASPIRIN 81mg  DAILY NITROGLYCERIN- SUBLINGUAL TABLETS- PLEASE SEE INFORMATION SHEET ATTACHED FOR INSTRUCTIONS *If you need a refill on your cardiac medications before your next appointment, please call your pharmacy*  Lab Work: CMET, CBC, TSH, LIPID, RENIN/ALDOSTERONE LEVELS- TODAY COVID TEST- Friday JAN 7th at 1:05pm and [place], [place]  If you have labs (blood work) drawn today and your tests are completely normal, you will receive your results only by: Marland Kitchen MyChart Message (if you have MyChart) OR . A paper copy in the mail If you have any lab test that is abnormal or we need to change your treatment, we will call you to review the results.  Testing/Procedures: Your physician has requested that you have  an echocardiogram. Echocardiography is a painless test that uses sound waves to create images of your heart. It provides your doctor with information about the size and shape of your heart and how well your heart's chambers and valves are working. You may receive an ultrasound enhancing agent through an IV if needed to better visualize your heart during the echo.This procedure takes approximately one hour. There are no restrictions for this procedure. This will take place at the [address], [address].   Your physician has recommended that you have a sleep study. This test records several body functions during sleep, including: brain activity, eye movement, oxygen and carbon dioxide blood levels, heart rate and rhythm, breathing rate and rhythm, the flow of air through your mouth and nose, snoring, body muscle movements, and chest and belly movement. SOMEONE WILL CONTACT YOU TO SCHEDULE THIS   LEFT HEART CATH- PLEASE SEE INFORMATION SHEET ATTACHED.   Follow-Up: At [facility], you and your health needs are our priority.  As part of our continuing mission to provide you with exceptional heart care, we have created designated Provider Care Teams.  These Care Teams include your primary Cardiologist (physician) and Advanced Practice Providers (APPs -  Physician Assistants and Nurse Practitioners) who all work together to provide you with the care you need, when you need it.  Your next appointment:   2 week(s) FOLLOWING HEART CATH  The format for your next appointment:   In Person  Provider:   Oswaldo Milian, MD  Other Instructions   [address] [place] [address] Dept: [phone] Loc: [place]  02/01/2020  You are scheduled for a Cardiac Catheterization on Monday, January 10 with Dr. Peter Martinique.  1. Please arrive at the [facility] (Main Entrance  A) at [facility]: [address] [place], [place] [address] at 5:30 AM (This time is two hours before your procedure to ensure your preparation).  Free valet parking service is available.   Special note: Every effort is made to have your procedure done on time. Please understand that emergencies sometimes delay scheduled procedures.  2. Diet: Do not eat solid foods after midnight.  The patient may have clear liquids until 5am upon the day of the procedure.  3. Labs: You will need to have blood drawn on Wednesday, December 29 at [facility] [address], [place]  Open: 8am - 5pm (Lunch 12:30 - 1:30)   Phone: [phone]. You do not need to be fasting.  4. Medication instructions in preparation for your procedure:  On the morning of your procedure, take your Aspirin and any morning medicines NOT listed above.  You may use sips of water.  5. Plan for one night stay--bring personal belongings. 6. Bring a current list of your medications and current insurance cards. 7. You MUST have a responsible person to drive you home. 8. Someone MUST be with you the first 24 hours after you arrive home or your discharge will be delayed. 9. Please wear clothes that are easy to get on and off and wear slip-on shoes.  Thank you for allowing [place] to care for you!   -- [facility]   Nitroglycerin sublingual tablets What is this medicine? NITROGLYCERIN (nye troe GLI ser in) is a type of vasodilator. It relaxes blood vessels, increasing the blood and oxygen supply to your heart. This medicine is used to relieve chest pain caused by angina. It is also used to prevent chest pain before activities like climbing stairs, going outdoors in cold weather, or sexual activity. This medicine may be used for other purposes; ask your health care provider or pharmacist if you have questions. COMMON BRAND NAME(S): Nitroquick, Nitrostat, Nitrotab What should I tell my health  care provider before I take this medicine? They need to know if you have any of these conditions:  anemia  head injury, recent stroke, or bleeding in the brain  liver disease  previous heart attack  an unusual or allergic reaction to nitroglycerin, other medicines, foods, dyes, or preservatives  pregnant or trying to get pregnant  breast-feeding How should I use this medicine? Take this medicine by mouth as needed. At the first sign of an angina attack (chest pain or tightness) place one tablet under your tongue. You can also take this medicine 5 to 10 minutes before an event likely to produce chest pain. Follow the directions on the prescription label. Let the tablet dissolve under the tongue. Do not swallow whole. Replace the dose if you accidentally swallow it. It will help if your mouth is not dry. Saliva around the tablet will help it to dissolve more quickly. Do not eat or drink, smoke or chew tobacco while a tablet is dissolving. If you are not better within 5 minutes after taking ONE dose of nitroglycerin, call 9-1-1 immediately to seek emergency medical care. Do not take more than 3 nitroglycerin tablets over 15 minutes. If you take this medicine often to relieve symptoms of angina, your doctor or health care professional may provide you with different instructions to manage your symptoms. If symptoms do not go away after following these instructions, it is important to call 9-1-1 immediately. Do not take more than 3 nitroglycerin tablets over 15 minutes. Talk to your pediatrician regarding the use of this medicine in children. Special care may be needed. Overdosage: If you think you have taken too much of this medicine contact a  poison control center or emergency room at once. NOTE: This medicine is only for you. Do not share this medicine with others. What if I miss a dose? This does not apply. This medicine is only used as needed. What may interact with this medicine? Do not take  this medicine with any of the following medications:  certain migraine medicines like ergotamine and dihydroergotamine (DHE)  medicines used to treat erectile dysfunction like sildenafil, tadalafil, and vardenafil  riociguat This medicine may also interact with the following medications:  alteplase  aspirin  heparin  medicines for high blood pressure  medicines for mental depression  other medicines used to treat angina  phenothiazines like chlorpromazine, mesoridazine, prochlorperazine, thioridazine This list may not describe all possible interactions. Give your health care provider a list of all the medicines, herbs, non-prescription drugs, or dietary supplements you use. Also tell them if you smoke, drink alcohol, or use illegal drugs. Some items may interact with your medicine. What should I watch for while using this medicine? Tell your doctor or health care professional if you feel your medicine is no longer working. Keep this medicine with you at all times. Sit or lie down when you take your medicine to prevent falling if you feel dizzy or faint after using it. Try to remain calm. This will help you to feel better faster. If you feel dizzy, take several deep breaths and lie down with your feet propped up, or bend forward with your head resting between your knees. You may get drowsy or dizzy. Do not drive, use machinery, or do anything that needs mental alertness until you know how this drug affects you. Do not stand or sit up quickly, especially if you are an older patient. This reduces the risk of dizzy or fainting spells. Alcohol can make you more drowsy and dizzy. Avoid alcoholic drinks. Do not treat yourself for coughs, colds, or pain while you are taking this medicine without asking your doctor or health care professional for advice. Some ingredients may increase your blood pressure. What side effects may I notice from receiving this medicine? Side effects that you should  report to your doctor or health care professional as soon as possible:  blurred vision  dry mouth  skin rash  sweating  the feeling of extreme pressure in the head  unusually weak or tired Side effects that usually do not require medical attention (report to your doctor or health care professional if they continue or are bothersome):  flushing of the face or neck  headache  irregular heartbeat, palpitations  nausea, vomiting This list may not describe all possible side effects. Call your doctor for medical advice about side effects. You may report side effects to FDA at 1-800-FDA-1088. Where should I keep my medicine? Keep out of the reach of children. Store at room temperature between 20 and 25 degrees C (68 and 77 degrees F). Store in [employer]. Protect from light and moisture. Keep tightly closed. Throw away any unused medicine after the expiration date. NOTE: This sheet is a summary. It may not cover all possible information. If you have questions about this medicine, talk to your doctor, pharmacist, or health care provider.  2020 Elsevier/Gold Standard (2012-11-18 17:57:36)      Signed, Donato Heinz, MD  02/01/2020 9:24 AM    [facility]

## 2020-02-01 NOTE — Patient Instructions (Addendum)
Medication Instructions:  START: ASPIRIN 81mg  DAILY NITROGLYCERIN- SUBLINGUAL TABLETS- PLEASE SEE INFORMATION SHEET ATTACHED FOR INSTRUCTIONS *If you need a refill on your cardiac medications before your next appointment, please call your pharmacy*  Lab Work: CMET, CBC, TSH, LIPID, RENIN/ALDOSTERONE LEVELS- TODAY COVID TEST- Friday JAN 7th at 1:05pm and [address], [place]  If you have labs (blood work) drawn today and your tests are completely normal, you will receive your results only by: [facility] MyChart Message (if you have MyChart) OR . A paper copy in the mail If you have any lab test that is abnormal or we need to change your treatment, we will call you to review the results.  Testing/Procedures: Your physician has requested that you have an echocardiogram. Echocardiography is a painless test that uses sound waves to create images of your heart. It provides your doctor with information about the size and shape of your heart and how well your heart's chambers and valves are working. You may receive an ultrasound enhancing agent through an IV if needed to better visualize your heart during the echo.This procedure takes approximately one hour. There are no restrictions for this procedure. This will take place at the [address]   Your physician has recommended that you have a sleep study. This test records several body functions during sleep, including: brain activity, eye movement, oxygen and carbon dioxide blood levels, heart rate and rhythm, breathing rate and rhythm, the flow of air through your mouth and nose, snoring, body muscle movements, and chest and belly movement. SOMEONE WILL CONTACT YOU TO SCHEDULE THIS   LEFT HEART CATH- PLEASE SEE INFORMATION SHEET ATTACHED.   Follow-Up: At [facility], you and your health needs are our priority.  As part of our continuing mission to provide you with exceptional heart care, we have created designated Provider Care  Teams.  These Care Teams include your primary Cardiologist (physician) and Advanced Practice Providers (APPs -  Physician Assistants and Nurse Practitioners) who all work together to provide you with the care you need, when you need it.  Your next appointment:   2 week(s) FOLLOWING HEART CATH  The format for your next appointment:   In Person  Provider:   [facility], MD  Other Instructions   [facility] CARDIOVASCULAR DIVISION [facility] [address] [place] [address] [place] Dept: [phone] Loc: [phone]  Hyland Mollenkopf  02/01/2020  You are scheduled for a Cardiac Catheterization on Monday, January 10 with Dr. Peter January 12.  1. Please arrive at the [facility] (Main Entrance A) at [facility]: [address] [place], [place] at 5:30 AM (This time is two hours before your procedure to ensure your preparation). Free valet parking service is available.   Special note: Every effort is made to have your procedure done on time. Please understand that emergencies sometimes delay scheduled procedures.  2. Diet: Do not eat solid foods after midnight.  The patient may have clear liquids until 5am upon the day of the procedure.  3. Labs: You will need to have blood drawn on Wednesday, December 29 at [facility] [address], [facility]  Open: 8am - 5pm (Lunch 12:30 - 1:30)   Phone: [phone]. You do not need to be fasting.  4. Medication instructions in preparation for your procedure:  On the morning of your procedure, take your Aspirin and any morning medicines NOT listed above.  You may use sips  of water.  5. Plan for one night stay--bring personal belongings. 6. Bring a current list of your medications and current insurance cards. 7. You MUST have a responsible person to drive you home. 8. Someone MUST be with you the first 24 hours after you arrive home or your discharge will be  delayed. 9. Please wear clothes that are easy to get on and off and wear slip-on shoes.  Thank you for allowing [place] to care for you!   -- [facility]   Nitroglycerin sublingual tablets What is this medicine? NITROGLYCERIN (nye troe GLI ser in) is a type of vasodilator. It relaxes blood vessels, increasing the blood and oxygen supply to your heart. This medicine is used to relieve chest pain caused by angina. It is also used to prevent chest pain before activities like climbing stairs, going outdoors in cold weather, or sexual activity. This medicine may be used for other purposes; ask your health care provider or pharmacist if you have questions. COMMON BRAND NAME(S): Nitroquick, Nitrostat, Nitrotab What should I tell my health care provider before I take this medicine? They need to know if you have any of these conditions:  anemia  head injury, recent stroke, or bleeding in the brain  liver disease  previous heart attack  an unusual or allergic reaction to nitroglycerin, other medicines, foods, dyes, or preservatives  pregnant or trying to get pregnant  breast-feeding How should I use this medicine? Take this medicine by mouth as needed. At the first sign of an angina attack (chest pain or tightness) place one tablet under your tongue. You can also take this medicine 5 to 10 minutes before an event likely to produce chest pain. Follow the directions on the prescription label. Let the tablet dissolve under the tongue. Do not swallow whole. Replace the dose if you accidentally swallow it. It will help if your mouth is not dry. Saliva around the tablet will help it to dissolve more quickly. Do not eat or drink, smoke or chew tobacco while a tablet is dissolving. If you are not better within 5 minutes after taking ONE dose of nitroglycerin, call 9-1-1 immediately to seek emergency medical care. Do not take more than 3 nitroglycerin tablets over 15  minutes. If you take this medicine often to relieve symptoms of angina, your doctor or health care professional may provide you with different instructions to manage your symptoms. If symptoms do not go away after following these instructions, it is important to call 9-1-1 immediately. Do not take more than 3 nitroglycerin tablets over 15 minutes. Talk to your pediatrician regarding the use of this medicine in children. Special care may be needed. Overdosage: If you think you have taken too much of this medicine contact a poison control center or emergency room at once. NOTE: This medicine is only for you. Do not share this medicine with others. What if I miss a dose? This does not apply. This medicine is only used as needed. What may interact with this medicine? Do not take this medicine with any of the following medications:  certain migraine medicines like ergotamine and dihydroergotamine (DHE)  medicines used to treat erectile dysfunction like sildenafil, tadalafil, and vardenafil  riociguat This medicine may also interact with the following medications:  alteplase  aspirin  heparin  medicines for high blood pressure  medicines for mental depression  other medicines used to treat angina  phenothiazines like chlorpromazine, mesoridazine, prochlorperazine, thioridazine This list may not describe all possible  interactions. Give your health care provider a list of all the medicines, herbs, non-prescription drugs, or dietary supplements you use. Also tell them if you smoke, drink alcohol, or use illegal drugs. Some items may interact with your medicine. What should I watch for while using this medicine? Tell your doctor or health care professional if you feel your medicine is no longer working. Keep this medicine with you at all times. Sit or lie down when you take your medicine to prevent falling if you feel dizzy or faint after using it. Try to remain calm. This will help you to  feel better faster. If you feel dizzy, take several deep breaths and lie down with your feet propped up, or bend forward with your head resting between your knees. You may get drowsy or dizzy. Do not drive, use machinery, or do anything that needs mental alertness until you know how this drug affects you. Do not stand or sit up quickly, especially if you are an older patient. This reduces the risk of dizzy or fainting spells. Alcohol can make you more drowsy and dizzy. Avoid alcoholic drinks. Do not treat yourself for coughs, colds, or pain while you are taking this medicine without asking your doctor or health care professional for advice. Some ingredients may increase your blood pressure. What side effects may I notice from receiving this medicine? Side effects that you should report to your doctor or health care professional as soon as possible:  blurred vision  dry mouth  skin rash  sweating  the feeling of extreme pressure in the head  unusually weak or tired Side effects that usually do not require medical attention (report to your doctor or health care professional if they continue or are bothersome):  flushing of the face or neck  headache  irregular heartbeat, palpitations  nausea, vomiting This list may not describe all possible side effects. Call your doctor for medical advice about side effects. You may report side effects to FDA at 1-800-FDA-1088. Where should I keep my medicine? Keep out of the reach of children. Store at room temperature between 20 and 25 degrees C (68 and 77 degrees F). Store in Chief of Staff. Protect from light and moisture. Keep tightly closed. Throw away any unused medicine after the expiration date. NOTE: This sheet is a summary. It may not cover all possible information. If you have questions about this medicine, talk to your doctor, pharmacist, or health care provider.  2020 Elsevier/Gold Standard (2012-11-18 17:57:36)

## 2020-02-02 ENCOUNTER — Other Ambulatory Visit: Payer: Self-pay | Admitting: *Deleted

## 2020-02-02 LAB — ALDOSTERONE + RENIN ACTIVITY W/ RATIO

## 2020-02-02 MED ORDER — ATORVASTATIN CALCIUM 40 MG PO TABS: 40.0000 mg | ORAL_TABLET | Freq: Every day | ORAL | 3 refills | Status: DC

## 2020-02-07 ENCOUNTER — Ambulatory Visit: Payer: Self-pay | Admitting: Family Medicine

## 2020-02-09 ENCOUNTER — Telehealth: Payer: Self-pay | Admitting: *Deleted

## 2020-02-09 NOTE — Telephone Encounter (Signed)
Pt contacted pre-catheterization scheduled at [facility] for: Monday February 13, 2020 7:30 AM Verified arrival time and place: [facility] Main Entrance A [facility]) at: 5:30 AM   No solid food after midnight prior to cath, clear liquids until 5 AM day of procedure.  Hold: HCTZ-AM of procedure  Spironolactone-AM of procedure  Except hold medications AM meds can be  taken pre-cath with sips of water including: ASA 81 mg   Confirmed patient has responsible adult to drive home post procedure and be with patient first 24 hours after arriving home: yes  You are allowed ONE visitor in the waiting room during the time you are at the hospital for your procedure. Both you and your visitor must wear a mask once you enter the hospital.   Reviewed procedure/mask/visitor instructions with patient.

## 2020-02-10 ENCOUNTER — Other Ambulatory Visit (HOSPITAL_COMMUNITY)
Admission: RE | Admit: 2020-02-10 | Discharge: 2020-02-10 | Disposition: A | Discharge status: Home / Self Care | Payer: Managed Care, Other (non HMO) | Source: Ambulatory Visit | Attending: Cardiology | Admitting: Cardiology

## 2020-02-10 ENCOUNTER — Telehealth: Payer: Self-pay | Admitting: Cardiology

## 2020-02-10 DIAGNOSIS — Z20822 Contact with and (suspected) exposure to covid-19: Secondary | ICD-10-CM | POA: Insufficient documentation

## 2020-02-10 DIAGNOSIS — Z01812 Encounter for preprocedural laboratory examination: Secondary | ICD-10-CM | POA: Insufficient documentation

## 2020-02-10 NOTE — Telephone Encounter (Signed)
PA pending for split night - Case #[id number].  Clinicals faxed in.

## 2020-02-11 LAB — SARS CORONAVIRUS 2 (TAT 6-24 HRS): SARS Coronavirus 2: NEGATIVE

## 2020-02-13 ENCOUNTER — Other Ambulatory Visit (HOSPITAL_COMMUNITY): Payer: Self-pay | Admitting: Cardiology

## 2020-02-13 ENCOUNTER — Ambulatory Visit (HOSPITAL_COMMUNITY)
Admission: RE | Disposition: A | Discharge status: Home / Self Care | Payer: Self-pay | Source: Home / Self Care | Attending: Cardiology

## 2020-02-13 ENCOUNTER — Ambulatory Visit (HOSPITAL_COMMUNITY)
Admission: RE | Admit: 2020-02-13 | Discharge: 2020-02-13 | Disposition: A | Discharge status: Home / Self Care | Payer: Managed Care, Other (non HMO) | Attending: Cardiology | Admitting: Cardiology

## 2020-02-13 DIAGNOSIS — E785 Hyperlipidemia, unspecified: Secondary | ICD-10-CM | POA: Diagnosis not present

## 2020-02-13 DIAGNOSIS — Z79899 Other long term (current) drug therapy: Secondary | ICD-10-CM | POA: Insufficient documentation

## 2020-02-13 DIAGNOSIS — I10 Essential (primary) hypertension: Secondary | ICD-10-CM | POA: Diagnosis present

## 2020-02-13 DIAGNOSIS — Z955 Presence of coronary angioplasty implant and graft: Secondary | ICD-10-CM

## 2020-02-13 DIAGNOSIS — I25119 Atherosclerotic heart disease of native coronary artery with unspecified angina pectoris: Secondary | ICD-10-CM | POA: Insufficient documentation

## 2020-02-13 DIAGNOSIS — R0683 Snoring: Secondary | ICD-10-CM | POA: Diagnosis not present

## 2020-02-13 DIAGNOSIS — Z7982 Long term (current) use of aspirin: Secondary | ICD-10-CM | POA: Insufficient documentation

## 2020-02-13 DIAGNOSIS — I209 Angina pectoris, unspecified: Secondary | ICD-10-CM | POA: Diagnosis present

## 2020-02-13 HISTORY — PX: CORONARY STENT INTERVENTION: CATH118234

## 2020-02-13 HISTORY — PX: LEFT HEART CATH AND CORONARY ANGIOGRAPHY: CATH118249

## 2020-02-13 LAB — POCT ACTIVATED CLOTTING TIME
Activated Clotting Time: 297 seconds
Activated Clotting Time: 261 seconds
Activated Clotting Time: 481 seconds

## 2020-02-13 SURGERY — LEFT HEART CATH AND CORONARY ANGIOGRAPHY
Anesthesia: LOCAL

## 2020-02-13 MED ORDER — LIDOCAINE HCL (PF) 1 % IJ SOLN
INTRAMUSCULAR | Status: DC | PRN
  Administered 2020-02-13: 2 mL

## 2020-02-13 MED ORDER — HEPARIN SODIUM (PORCINE) 1000 UNIT/ML IJ SOLN
INTRAMUSCULAR | Status: AC
  Filled 2020-02-13: qty 1

## 2020-02-13 MED ORDER — SODIUM CHLORIDE 0.9% FLUSH: 3.0000 mL | Freq: Two times a day (BID) | INTRAVENOUS | Status: DC

## 2020-02-13 MED ORDER — IOHEXOL 350 MG/ML SOLN
INTRAVENOUS | Status: DC | PRN
  Administered 2020-02-13: 220 mL via INTRA_ARTERIAL

## 2020-02-13 MED ORDER — NITROGLYCERIN 1 MG/10 ML FOR IR/CATH LAB
INTRA_ARTERIAL | Status: DC | PRN
  Administered 2020-02-13: 200 ug via INTRACORONARY

## 2020-02-13 MED ORDER — VERAPAMIL HCL 2.5 MG/ML IV SOLN
INTRAVENOUS | Status: DC | PRN
  Administered 2020-02-13: 10 mL via INTRA_ARTERIAL

## 2020-02-13 MED ORDER — ACETAMINOPHEN 325 MG PO TABS: 650.0000 mg | ORAL_TABLET | ORAL | Status: DC | PRN

## 2020-02-13 MED ORDER — SODIUM CHLORIDE 0.9 % WEIGHT BASED INFUSION
3.0000 mL/kg/h | INTRAVENOUS | Status: DC
  Administered 2020-02-13: 3 mL/kg/h via INTRAVENOUS

## 2020-02-13 MED ORDER — ASPIRIN 81 MG PO CHEW: 81.0000 mg | CHEWABLE_TABLET | ORAL | Status: DC

## 2020-02-13 MED ORDER — SODIUM CHLORIDE 0.9 % WEIGHT BASED INFUSION: 1.0000 mL/kg/h | INTRAVENOUS | Status: DC

## 2020-02-13 MED ORDER — SODIUM CHLORIDE 0.9% FLUSH: 3.0000 mL | INTRAVENOUS | Status: DC | PRN

## 2020-02-13 MED ORDER — MIDAZOLAM HCL 2 MG/2ML IJ SOLN
INTRAMUSCULAR | Status: DC | PRN
  Administered 2020-02-13 (×3): 1 mg via INTRAVENOUS

## 2020-02-13 MED ORDER — NITROGLYCERIN 1 MG/10 ML FOR IR/CATH LAB
INTRA_ARTERIAL | Status: AC
  Filled 2020-02-13: qty 10

## 2020-02-13 MED ORDER — ATORVASTATIN CALCIUM 80 MG PO TABS: 80.0000 mg | ORAL_TABLET | Freq: Every day | ORAL | 1 refills | Status: DC

## 2020-02-13 MED ORDER — SODIUM CHLORIDE 0.9 % IV SOLN: 250.0000 mL | INTRAVENOUS | Status: DC | PRN

## 2020-02-13 MED ORDER — TICAGRELOR 90 MG PO TABS
ORAL_TABLET | ORAL | Status: AC
  Filled 2020-02-13: qty 2

## 2020-02-13 MED ORDER — ONDANSETRON HCL 4 MG/2ML IJ SOLN: 4.0000 mg | Freq: Four times a day (QID) | INTRAMUSCULAR | Status: DC | PRN

## 2020-02-13 MED ORDER — SODIUM CHLORIDE 0.9 % WEIGHT BASED INFUSION: 1.0000 mL/kg/h | INTRAVENOUS | Status: AC

## 2020-02-13 MED ORDER — VERAPAMIL HCL 2.5 MG/ML IV SOLN
INTRAVENOUS | Status: AC
  Filled 2020-02-13: qty 2

## 2020-02-13 MED ORDER — HEPARIN SODIUM (PORCINE) 1000 UNIT/ML IJ SOLN
INTRAMUSCULAR | Status: DC | PRN
  Administered 2020-02-13 (×2): 7000 [IU] via INTRAVENOUS
  Administered 2020-02-13: 4000 [IU] via INTRAVENOUS

## 2020-02-13 MED ORDER — TICAGRELOR 90 MG PO TABS: 90.0000 mg | ORAL_TABLET | Freq: Two times a day (BID) | ORAL | 2 refills | Status: DC

## 2020-02-13 MED ORDER — MIDAZOLAM HCL 2 MG/2ML IJ SOLN
INTRAMUSCULAR | Status: AC
  Filled 2020-02-13: qty 2

## 2020-02-13 MED ORDER — TICAGRELOR 90 MG PO TABS
ORAL_TABLET | ORAL | Status: DC | PRN
  Administered 2020-02-13: 180 mg via ORAL

## 2020-02-13 MED ORDER — LIDOCAINE HCL (PF) 1 % IJ SOLN
INTRAMUSCULAR | Status: AC
  Filled 2020-02-13: qty 30

## 2020-02-13 MED ORDER — HEPARIN (PORCINE) IN NACL 1000-0.9 UT/500ML-% IV SOLN
INTRAVENOUS | Status: AC
  Filled 2020-02-13: qty 1000

## 2020-02-13 MED ORDER — FENTANYL CITRATE (PF) 100 MCG/2ML IJ SOLN
INTRAMUSCULAR | Status: DC | PRN
  Administered 2020-02-13 (×4): 25 ug via INTRAVENOUS

## 2020-02-13 MED ORDER — TICAGRELOR 90 MG PO TABS: 90.0000 mg | ORAL_TABLET | Freq: Two times a day (BID) | ORAL | Status: DC

## 2020-02-13 MED ORDER — FENTANYL CITRATE (PF) 100 MCG/2ML IJ SOLN
INTRAMUSCULAR | Status: AC
  Filled 2020-02-13: qty 2

## 2020-02-13 MED FILL — BRILINTA 90 MG TABLET: 90 | 30 days supply | Qty: 60 | Fill #0

## 2020-02-13 SURGICAL SUPPLY — 24 items
BALLN SAPPHIRE 2.5X12 (BALLOONS) ×2
BALLN SAPPHIRE ~~LOC~~ 3.5X12 (BALLOONS) ×1 IMPLANT
BALLOON SAPPHIRE 2.5X12 (BALLOONS) IMPLANT
CATH 5FR JL3.5 JR4 ANG PIG MP (CATHETERS) ×1 IMPLANT
CATH LAUNCHER 6FR EBU 4 (CATHETERS) ×1 IMPLANT
CATH LAUNCHER 6FR EBU3.5 (CATHETERS) ×1 IMPLANT
CATH OPTICROSS HD (CATHETERS) ×1 IMPLANT
CATH SUPERCROSS ANGLED 90 DEG (MICROCATHETER) ×1 IMPLANT
CATH TELESCOPE 6F GEC (CATHETERS) ×1 IMPLANT
DEVICE RAD COMP TR BAND LRG (VASCULAR PRODUCTS) ×1 IMPLANT
GLIDESHEATH SLEND SS 6F .021 (SHEATH) ×1 IMPLANT
GUIDEWIRE INQWIRE 1.5J.035X260 (WIRE) IMPLANT
INQWIRE 1.5J .035X260CM (WIRE) ×2
KIT ENCORE 26 ADVANTAGE (KITS) ×1 IMPLANT
KIT HEART LEFT (KITS) ×2 IMPLANT
PACK CARDIAC CATHETERIZATION (CUSTOM PROCEDURE TRAY) ×2 IMPLANT
SHEATH PROBE COVER 6X72 (BAG) ×1 IMPLANT
SLED PULL BACK IVUS (MISCELLANEOUS) ×1 IMPLANT
STENT RESOLUTE ONYX 3.0X15 (Permanent Stent) ×1 IMPLANT
TRANSDUCER W/STOPCOCK (MISCELLANEOUS) ×2 IMPLANT
TUBING CIL FLEX 10 FLL-RA (TUBING) ×2 IMPLANT
WIRE ASAHI PROWATER 180CM (WIRE) ×1 IMPLANT
WIRE ASAHI PROWATER 300CM (WIRE) ×1 IMPLANT
WIRE HI TORQ WHISPER MS 190CM (WIRE) ×2 IMPLANT

## 2020-02-13 NOTE — Discharge Summary (Signed)
Discharge Summary for Same Day PCI   Patient ID: Jonathan Barrett MRN: [id number]; DOB: March 30, 1965  Admit date: 02/13/2020 Discharge date: 02/13/2020  Primary Care Provider: Dettinger, Fransisca Kaufmann, MD  Primary Cardiologist: Donato Heinz, MD  Primary Electrophysiologist:  None   Discharge Diagnoses    Principal Problem:   Angina pectoris [facility]) Active Problems:   Hypertension   Hyperlipidemia   Morbid obesity [facility])    Diagnostic Studies/Procedures    Cardiac Catheterization 02/13/2020:   Prox LAD to Mid LAD lesion is 35% stenosed.  Ramus lesion is 65% stenosed.  Prox Cx to Mid Cx lesion is 85% stenosed.  Prox RCA to Mid RCA lesion is 100% stenosed.  Post intervention, there is a 0% residual stenosis.  A drug-eluting stent was successfully placed using a STENT RESOLUTE ONYX 3.0X15.  The left ventricular systolic function is normal.  LV end diastolic pressure is mildly elevated.  The left ventricular ejection fraction is 55-65% by visual estimate.   1. 2 vessel obstructive CAD.     - complex 85% proximal LCx lesion    - 100% CTO of the mid RCA with left to right collaterals.     - 65% ramus intermediate.     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 Normal LV function 3. Mildly elevated LVEDP 20 mm Hg 4. Successful PCI of the proximal LCx with DES  Plan: continue aggressive medical therapy and risk factor modification. Needs to lose weight. If he has refractory angina despite optimal medical therapy we could consider CTO PCI of the RCA  Diagnostic Dominance: Right    Intervention     _____________   History of Present Illness     Jonathan Barrett is a 55 y.o. male with a hx of hypertension, hyperlipidemia who presented for an initial evaluation of chest pain.  He reported that his chest pain started in the last 2 months prior to office visit on 02/01/20.  Reported occurring about twice per week.  Described tightness in center chest and  shoulders that occurred with significant exertion.  Also associated with shortness of breath.  Typically lasted for few minutes and resolved with rest.  Always occurred with exertion, reported most recent episode occurred when he was carrying Christmas presents.  Reported he has gained 60 pounds in the last several years. He denied any lightheadedness, syncope, palpitations, or lower extremity edema.  No smoking history.  Family history includes father died of MI at age 4 and mother died of MI at age 71.   Cardiac catheterization was arranged for further evaluation.  Hospital Course     The patient underwent cardiac cath as noted above with 2 vessel obstructive CAD with complex 85% pLcx, 100% CTO of the mRCA with left to right collaterals and 65% ramus intermediate. Normal LV function. Successful PCI of the pLcx with DES. Plan for DAPT with ASA/Brilinta for at least one year. The patient was seen by cardiac rehab while in short stay. There were no observed complications post cath. Radial cath site was re-evaluated prior to discharge and found to be stable without any complications. Instructions/precautions regarding cath site care were given prior to discharge. Also increased his atorvastatin to 80mg  daily.   Jonathan Barrett was seen by Dr. [place] and determined stable for discharge home. Follow up with our office has been arranged. Medications are listed below. Pertinent changes include addition of Brilinta and increased atorvastatin to 80mg  daily.   _____________  Cath/PCI Registry Performance & Quality Measures:  1. Aspirin prescribed? - Yes 2. ADP Receptor Inhibitor (Plavix/Clopidogrel, Brilinta/Ticagrelor or Effient/Prasugrel) prescribed (includes medically managed patients)? - Yes 3. High Intensity Statin (Lipitor 40-80mg  or Crestor 20-40mg ) prescribed? - Yes 4. For EF <40%, was ACEI/ARB prescribed? - Not Applicable (EF >/= [id number]) 5. For EF <40%, Aldosterone Antagonist (Spironolactone or  Eplerenone) prescribed? - Not Applicable (EF >/= [id number]) 6. Cardiac Rehab Phase II ordered (Included Medically managed Patients)? - Yes  _____________   Discharge Vitals Blood pressure 124/75, pulse 78, temperature 98.1 F (36.7 C), temperature source Oral, resp. rate (!) 7, height 6\' 2"  (1.88 m), weight (!) 149.7 kg, SpO2 98 %.  Filed Weights   02/13/20 0607  Weight: (!) 149.7 kg    Last Labs & Radiologic Studies    CBC No results for input(s): WBC, NEUTROABS, HGB, HCT, MCV, PLT in the last 72 hours. Basic Metabolic Panel No results for input(s): NA, K, CL, CO2, GLUCOSE, BUN, CREATININE, CALCIUM, MG, PHOS in the last 72 hours. Liver Function Tests No results for input(s): AST, ALT, ALKPHOS, BILITOT, PROT, ALBUMIN in the last 72 hours. No results for input(s): LIPASE, AMYLASE in the last 72 hours. High Sensitivity Troponin:   No results for input(s): TROPONINIHS in the last 720 hours.  BNP Invalid input(s): POCBNP D-Dimer No results for input(s): DDIMER in the last 72 hours. Hemoglobin A1C No results for input(s): HGBA1C in the last 72 hours. Fasting Lipid Panel No results for input(s): CHOL, HDL, LDLCALC, TRIG, CHOLHDL, LDLDIRECT in the last 72 hours. Thyroid Function Tests No results for input(s): TSH, T4TOTAL, T3FREE, THYROIDAB in the last 72 hours.  Invalid input(s): FREET3 _____________  CARDIAC CATHETERIZATION  Result Date: 02/13/2020  Prox LAD to Mid LAD lesion is 35% stenosed.  Ramus lesion is 65% stenosed.  Prox Cx to Mid Cx lesion is 85% stenosed.  Prox RCA to Mid RCA lesion is 100% stenosed.  Post intervention, there is a 0% residual stenosis.  A drug-eluting stent was successfully placed using a STENT RESOLUTE ONYX 3.0X15.  The left ventricular systolic function is normal.  LV end diastolic pressure is mildly elevated.  The left ventricular ejection fraction is 55-65% by visual estimate.  1. 2 vessel obstructive CAD.    - complex 85% proximal LCx lesion    -  100% CTO of the mid RCA with left to right collaterals.    - 65% ramus intermediate.    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 Normal LV function 3. Mildly elevated LVEDP 20 mm Hg 4. Successful PCI of the proximal LCx with DES Plan: continue aggressive medical therapy and risk factor modification. Needs to lose weight. If he has refractory angina despite optimal medical therapy we could consider CTO PCI of the RCA    Disposition   Pt is being discharged home today in good condition.  Follow-up Plans & Appointments     Follow-up Information    Donato Heinz, MD Follow up on 02/29/2020.   Specialties: Cardiology, Radiology Why: at 8am for your follow up appt. Contact information: [address] [place] [address] [phone]              Discharge Instructions    Amb Referral to Cardiac Rehabilitation   Complete by: As directed    Diagnosis:  Coronary Stents PTCA     After initial evaluation and assessments completed: Virtual Based Care may be provided alone or in conjunction with Phase 2 Cardiac Rehab based on patient barriers.: Yes  Discharge Medications   Allergies as of 02/13/2020   No Known Allergies     Medication List    TAKE these medications   amLODipine 10 MG tablet Commonly known as: NORVASC Take 1 tablet (10 mg total) by mouth daily. (Needs to be seen before next refill)   aspirin EC 81 MG tablet Take 1 tablet (81 mg total) by mouth daily. Swallow whole.   atorvastatin 80 MG tablet Commonly known as: Lipitor Take 1 tablet (80 mg total) by mouth daily. What changed:   medication strength  how much to take   hydrochlorothiazide 25 MG tablet Commonly known as: HYDRODIURIL Take 1 tablet (25 mg total) by mouth daily. (Needs to be seen before next refill)   metoprolol succinate 100 MG 24 hr tablet Commonly known as: TOPROL-XL Take 1 tablet (100 mg total) by mouth daily. Take with or immediately following a  meal. (Needs to be seen before next refill)   nitroGLYCERIN 0.4 MG SL tablet Commonly known as: NITROSTAT Place 1 tablet (0.4 mg total) under the tongue every 5 (five) minutes as needed for chest pain.   ramipril 10 MG capsule Commonly known as: ALTACE Take 1 capsule (10 mg total) by mouth 2 (two) times daily. (Needs to be seen before next refill)   spironolactone 25 MG tablet Commonly known as: ALDACTONE Take 1 tablet (25 mg total) by mouth daily.   ticagrelor 90 MG Tabs tablet Commonly known as: BRILINTA Take 1 tablet (90 mg total) by mouth 2 (two) times daily.       Allergies No Known Allergies  Outstanding Labs/Studies   FLP/LFTs in 8 weeks  Duration of Discharge Encounter   Greater than 30 minutes including physician time.  Signed, Reino Bellis, NP 02/13/2020, 1:23 PM

## 2020-02-13 NOTE — Progress Notes (Signed)
Discussed stent, Brilinta, restrictions, diet, exercise, NTG and CRPII. Pt very receptive, eager to get healthier. Will refer to [place]. Understands the importance of Brilinta. [place], ACSM 11:36 AM 02/13/2020

## 2020-02-13 NOTE — Discharge Instructions (Signed)
Drink plenty of fluids for 48 hours and keep wrist elevated at heart level for 24 hours  Radial Site Care   This sheet gives you information about how to care for yourself after your procedure. Your health care provider may also give you more specific instructions. If you have problems or questions, contact your health care provider. What can I expect after the procedure? After the procedure, it is common to have:  Bruising and tenderness at the catheter insertion area. Follow these instructions at home: Medicines  Take over-the-counter and prescription medicines only as told by your health care provider. Insertion site care 1. Follow instructions from your health care provider about how to take care of your insertion site. Make sure you: ? Wash your hands with soap and water before you change your bandage (dressing). If soap and water are not available, use hand sanitizer. ? Remove your dressing as told by your health care provider. In 24 hours 2. Check your insertion site every day for signs of infection. Check for: ? Redness, swelling, or pain. ? Fluid or blood. ? Pus or a bad smell. ? Warmth. 3. Do not take baths, swim, or use a hot tub until your health care provider approves. 4. You may shower 24-48 hours after the procedure, or as directed by your health care provider. ? Remove the dressing and gently wash the site with plain soap and water. ? Pat the area dry with a clean towel. ? Do not rub the site. That could cause bleeding. 5. Do not apply powder or lotion to the site. Activity   1. For 24 hours after the procedure, or as directed by your health care provider: ? Do not flex or bend the affected arm. ? Do not push or pull heavy objects with the affected arm. ? Do not drive yourself home from the hospital or clinic. You may drive 24 hours after the procedure unless your health care provider tells you not to. ? Do not operate machinery or power tools. 2. Do not lift  anything that is heavier than 5 lb, or the limit that you are told, until your health care provider says that it is safe.  For 1 week 3. Ask your health care provider when it is okay to: ? Return to work or school. ? Resume usual physical activities or sports. ? Resume sexual activity. General instructions  If the catheter site starts to bleed, raise your arm and put firm pressure on the site. If the bleeding does not stop, get help right away. This is a medical emergency.  If you went home on the same day as your procedure, a responsible adult should be with you for the first 24 hours after you arrive home.  Keep all follow-up visits as told by your health care provider. This is important. Contact a health care provider if:  You have a fever.  You have redness, swelling, or yellow drainage around your insertion site. Get help right away if:  You have unusual pain at the radial site.  The catheter insertion area swells very fast.  The insertion area is bleeding, and the bleeding does not stop when you hold steady pressure on the area.  Your arm or hand becomes pale, cool, tingly, or numb. These symptoms may represent a serious problem that is an emergency. Do not wait to see if the symptoms will go away. Get medical help right away. Call your local emergency services (911 in the [place] Do not drive   yourself to the hospital. Summary  After the procedure, it is common to have bruising and tenderness at the site.  Follow instructions from your health care provider about how to take care of your radial site wound. Check the wound every day for signs of infection.  Do not lift anything that is heavier than 5 lb, or the limit that you are told, until your health care provider says that it is safe. This information is not intended to replace advice given to you by your health care provider. Make sure you discuss any questions you have with your health care provider. Document Revised:  02/25/2017 Document Reviewed: 02/25/2017 Elsevier Patient Education  2020 Elsevier Inc.  

## 2020-02-13 NOTE — Interval H&P Note (Signed)
History and Physical Interval Note:  02/13/2020 7:14 AM  Jonathan Barrett  has presented today for surgery, with the diagnosis of chest pain.  The various methods of treatment have been discussed with the patient and family. After consideration of risks, benefits and other options for treatment, the patient has consented to  Procedure(s): LEFT HEART CATH AND CORONARY ANGIOGRAPHY (N/A) as a surgical intervention.  The patient's history has been reviewed, patient examined, no change in status, stable for surgery.  I have reviewed the patient's chart and labs.  Questions were answered to the patient's satisfaction.   Cath Lab Visit (complete for each Cath Lab visit)  Clinical Evaluation Leading to the Procedure:   ACS: No.  Non-ACS:    Anginal Classification: CCS III  Anti-ischemic medical therapy: Maximal Therapy (2 or more classes of medications)  Non-Invasive Test Results: No non-invasive testing performed  Prior CABG: No previous CABG        Jonathan Barrett Jonathan Barrett [facility] 02/13/2020 7:14 AM

## 2020-02-14 ENCOUNTER — Encounter (HOSPITAL_COMMUNITY): Payer: Self-pay | Admitting: Cardiology

## 2020-02-14 MED FILL — Heparin Sod (Porcine)-NaCl IV Soln 1000 Unit/500ML-0.9%: INTRAVENOUS | Qty: 1000 | Status: AC

## 2020-02-17 ENCOUNTER — Other Ambulatory Visit: Payer: Self-pay | Admitting: Family Medicine

## 2020-02-17 DIAGNOSIS — I10 Essential (primary) hypertension: Secondary | ICD-10-CM

## 2020-02-17 DIAGNOSIS — E782 Mixed hyperlipidemia: Secondary | ICD-10-CM

## 2020-02-21 LAB — ALDOSTERONE + RENIN ACTIVITY W/ RATIO
ALDOS/RENIN RATIO: 0.6 (ref 0.0–30.0)
ALDOSTERONE: 8.9 ng/dL (ref 0.0–30.0)
Renin: 14.252 ng/mL/hr — ABNORMAL HIGH (ref 0.167–5.380)

## 2020-02-22 ENCOUNTER — Other Ambulatory Visit: Payer: Self-pay | Admitting: Family Medicine

## 2020-02-22 DIAGNOSIS — I10 Essential (primary) hypertension: Secondary | ICD-10-CM

## 2020-02-26 NOTE — Progress Notes (Deleted)
Cardiology Office Note:    Date:  02/26/2020   ID:  Jonathan Barrett, DOB February 13, 1965, MRN [id number]  PCP:  Dettinger, Fransisca Kaufmann, MD  Cardiologist:  Donato Heinz, MD  Electrophysiologist:  None   Referring MD: Dettinger, Fransisca Kaufmann, MD   No chief complaint on file.   History of Present Illness:    Jonathan Barrett is a 55 y.o. male with a hx of CAD status post LCx PCI 02/13/2020, hypertension, hyperlipidemia who presents for follow-up.  He was initially seen on 02/01/2020.  Reported symptoms consistent with typical angina.  Cardiac catheterization on 02/13/2020 showed severe two-vessel obstructive CAD (85% proximal LCx stenosis, CTO of mid RCA with left-to-right collaterals, 65% ramus, 35% proximal to mid LAD, aneurysmal dilatation of distal left main), normal LV function, mild elevation in LVEDP 20 mmHg.  Treated with DES to proximal LCx.  Since his catheterization,   Wt Readings from Last 3 Encounters:  02/13/20 (!) 330 lb (149.7 kg)  02/01/20 (!) 331 lb (150.1 kg)  03/22/19 (!) 320 lb (145.2 kg)     Past Medical History:  Diagnosis Date  . Hyperlipidemia   . Hypertension     Past Surgical History:  Procedure Laterality Date  . CORONARY STENT INTERVENTION N/A 02/13/2020   Procedure: CORONARY STENT INTERVENTION;  Surgeon: Martinique, Peter M, MD;  Location: [place] [facility];  Service: Cardiovascular;  Laterality: N/A;  . CYST EXCISION    . LEFT HEART CATH AND CORONARY ANGIOGRAPHY N/A 02/13/2020   Procedure: LEFT HEART CATH AND CORONARY ANGIOGRAPHY;  Surgeon: Martinique, Peter M, MD;  Location: [place] [facility];  Service: Cardiovascular;  Laterality: N/A;  . MIDDLE EAR SURGERY      Current Medications: No outpatient medications have been marked as taking for the 02/29/20 encounter (Appointment) with Donato Heinz, MD.     Allergies:   Patient has no known allergies.   Social History   Socioeconomic History  . Marital status: Married    Spouse name: Not on file   . Number of children: Not on file  . Years of education: Not on file  . Highest education level: Not on file  Occupational History  . Not on file  Tobacco Use  . Smoking status: Never Smoker  . Smokeless tobacco: Never Used  Substance and Sexual Activity  . Alcohol use: Yes    Alcohol/week: 12.0 standard drinks    Types: 12 Cans of beer per week  . Drug use: No  . Sexual activity: Yes  Other Topics Concern  . Not on file  Social History Narrative  . Not on file   Social Determinants of Health   Financial Resource Strain: Not on file  Food Insecurity: Not on file  Transportation Needs: Not on file  Physical Activity: Not on file  Stress: Not on file  Social Connections: Not on file     Family History: The patient's family history includes Heart disease in his father.  ROS:   Please see the history of present illness.     All other systems reviewed and are negative.  EKGs/Labs/Other Studies Reviewed:    The following studies were reviewed today:   EKG:  EKG is ordered today.  The ekg ordered today demonstrates normal sinus rhythm, rate 86, no ST/T abnormalities  Recent Labs: 02/01/2020: ALT 31; BUN 17; Creatinine, Ser 0.93; Hemoglobin 15.3; Platelets 209; Potassium 4.4; Sodium 136; TSH 0.896  Recent Lipid Panel    Component Value Date/Time   CHOL 170  02/01/2020 0929   CHOL 212 (H) 06/22/2012 0946   TRIG 102 02/01/2020 0929   TRIG 174 (H) 01/31/2013 1216   TRIG 136 06/22/2012 0946   HDL 44 02/01/2020 0929   HDL 51 01/31/2013 1216   HDL 49 06/22/2012 0946   CHOLHDL 3.9 02/01/2020 0929   LDLCALC 107 (H) 02/01/2020 0929   LDLCALC 107 (H) 01/31/2013 1216   LDLCALC 136 (H) 06/22/2012 0946    Physical Exam:    VS:  There were no vitals taken for this visit.    Wt Readings from Last 3 Encounters:  02/13/20 (!) 330 lb (149.7 kg)  02/01/20 (!) 331 lb (150.1 kg)  03/22/19 (!) 320 lb (145.2 kg)     GEN:  in no acute distress HEENT: Normal NECK: No JVD; No  carotid bruits CARDIAC: RRR, no murmurs, rubs, gallops RESPIRATORY:  Clear to auscultation without rales, wheezing or rhonchi  ABDOMEN: Soft, non-tender, non-distended MUSCULOSKELETAL:  No edema; No deformity  SKIN: Warm and dry NEUROLOGIC:  Alert and oriented x 3 PSYCHIATRIC:  Normal affect   ASSESSMENT:    No diagnosis found. PLAN:     CAD: Reported symptoms consistent with typical angina.  Cardiac catheterization on 02/13/2020 showed severe two-vessel obstructive CAD (85% proximal LCx stenosis, CTO of mid RCA with left-to-right collaterals, 65% ramus, 35% proximal to mid LAD, aneurysmal dilatation of distal left main), normal LV function, mild elevation in LVEDP 20 mmHg.  Treated with DES to proximal LCx. -Continue aspirin 81 mg daily, ticagrelor 90 mg twice daily.  Plan DAPT for 1 year through 02/2021 -Continue atorvastatin 80 mg daily -Continue Toprol-XL 100 mg daily -As needed sublingual nitroglycerin -Echocardiogram  Hypertension: On amlodipine 10 mg daily, hydrochlorothiazide 25 mg daily, Toprol-XL 100 mg daily, ramipril 10 mg twice daily, spironolactone 25 mg daily.  Suspect untreated OSA contributing to resistant hypertension.  Normal aldosterone but elevated renin, will evaluate for renal artery stenosis with ultrasound.  Hyperlipidemia: LDL 107 on 02/01/2020.  Switch from simvastatin 40 mg daily to atorvastatin 80 mg daily for goal LDL less than 70.  Snoring: Sleep study as above  RTC in***   Medication Adjustments/Labs and Tests Ordered: Current medicines are reviewed at length with the patient today.  Concerns regarding medicines are outlined above.  No orders of the defined types were placed in this encounter.  No orders of the defined types were placed in this encounter.   There are no Patient Instructions on file for this visit.   Signed, Donato Heinz, MD  02/26/2020 1:30 PM    [place]

## 2020-02-28 ENCOUNTER — Other Ambulatory Visit: Payer: Managed Care, Other (non HMO)

## 2020-02-29 ENCOUNTER — Ambulatory Visit: Payer: Managed Care, Other (non HMO) | Admitting: Cardiology

## 2020-02-29 NOTE — Telephone Encounter (Signed)
PA was denied.  Change to HST?

## 2020-03-02 ENCOUNTER — Other Ambulatory Visit: Payer: Self-pay | Admitting: *Deleted

## 2020-03-02 MED ORDER — SPIRONOLACTONE 25 MG PO TABS: 25.0000 mg | ORAL_TABLET | Freq: Every day | ORAL | 0 refills | Status: DC

## 2020-03-02 MED ORDER — RAMIPRIL 10 MG PO CAPS: 10.0000 mg | ORAL_CAPSULE | Freq: Two times a day (BID) | ORAL | 0 refills | Status: DC

## 2020-03-02 MED ORDER — AMLODIPINE BESYLATE 10 MG PO TABS: 10.0000 mg | ORAL_TABLET | Freq: Every day | ORAL | 0 refills | Status: DC

## 2020-03-02 MED ORDER — METOPROLOL SUCCINATE ER 100 MG PO TB24: 100.0000 mg | ORAL_TABLET | Freq: Every day | ORAL | 0 refills | Status: DC

## 2020-03-02 MED ORDER — HYDROCHLOROTHIAZIDE 25 MG PO TABS: 25.0000 mg | ORAL_TABLET | Freq: Every day | ORAL | 0 refills | Status: DC

## 2020-03-09 ENCOUNTER — Other Ambulatory Visit: Payer: Self-pay | Admitting: Cardiology

## 2020-03-09 ENCOUNTER — Telehealth: Payer: Self-pay | Admitting: *Deleted

## 2020-03-09 DIAGNOSIS — I10 Essential (primary) hypertension: Secondary | ICD-10-CM

## 2020-03-09 DIAGNOSIS — R0683 Snoring: Secondary | ICD-10-CM

## 2020-03-09 NOTE — Telephone Encounter (Signed)
Left HST appointment details and contact information on voicemail.

## 2020-03-12 ENCOUNTER — Other Ambulatory Visit: Payer: Self-pay | Admitting: Cardiology

## 2020-03-12 ENCOUNTER — Other Ambulatory Visit: Payer: Self-pay | Admitting: *Deleted

## 2020-03-12 MED ORDER — TICAGRELOR 90 MG PO TABS: 90.0000 mg | ORAL_TABLET | Freq: Two times a day (BID) | ORAL | 3 refills | Status: DC

## 2020-03-16 ENCOUNTER — Other Ambulatory Visit: Payer: Self-pay

## 2020-03-16 ENCOUNTER — Ambulatory Visit (INDEPENDENT_AMBULATORY_CARE_PROVIDER_SITE_OTHER): Payer: Managed Care, Other (non HMO)

## 2020-03-16 DIAGNOSIS — R079 Chest pain, unspecified: Secondary | ICD-10-CM | POA: Diagnosis not present

## 2020-03-16 DIAGNOSIS — R0683 Snoring: Secondary | ICD-10-CM | POA: Diagnosis not present

## 2020-03-16 LAB — ECHOCARDIOGRAM COMPLETE
AR max vel: 4.78 cm2
AV Area VTI: 5.26 cm2
AV Area mean vel: 4.69 cm2
AV Mean grad: 4 mmHg
AV Peak grad: 8.1 mmHg
Ao pk vel: 1.42 m/s
Area-P 1/2: 2.12 cm2

## 2020-03-16 MED ORDER — PERFLUTREN LIPID MICROSPHERE
1.0000 mL | INTRAVENOUS | Status: AC | PRN
  Administered 2020-03-16: 2 mL via INTRAVENOUS

## 2020-03-18 NOTE — Progress Notes (Unsigned)
Cardiology Office Note:    Date:  03/20/2020   ID:  Jonathan Barrett, DOB 21-Feb-1965, MRN [id number]  PCP:  Dettinger, Fransisca Kaufmann, MD  Cardiologist:  Donato Heinz, MD  Electrophysiologist:  None   Referring MD: Dettinger, Fransisca Kaufmann, MD   Chief Complaint  Patient presents with  . Coronary Artery Disease    History of Present Illness:    Jonathan Barrett is a 55 y.o. male with a hx of CAD status post LCx PCI 02/13/2020, hypertension, hyperlipidemia who presents for follow-up.  He was initially seen on 02/01/2020.  Reported symptoms consistent with typical angina.  Cardiac catheterization on 02/13/2020 showed severe two-vessel obstructive CAD (85% proximal LCx stenosis, CTO of mid RCA with left-to-right collaterals, 65% ramus, 35% proximal to mid LAD, aneurysmal dilatation of distal left main), normal LV function, mild elevation in LVEDP 20 mmHg.  Treated with DES to proximal LCx.  Echocardiogram on 03/17/2019 showed normal biventricular function, grade 1 diastolic dysfunction, mild dilatation of aortic root measuring 42 mm.  Since his catheterization, he reports that he is doing well.  He denies any further chest pain or dyspnea.  Denies any lightheadedness, syncope, lower extremity edema, or palpitations.  He has lost 19 pounds, primarily due to dietary changes.  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  Does not exercise outside of work.  Reports BP improved, 130s over 80s when he checks at home.  He is taking DAPT, denies any bleeding issues.   Wt Readings from Last 3 Encounters:  03/20/20 (!) 312 lb (141.5 kg)  02/13/20 (!) 330 lb (149.7 kg)  02/01/20 (!) 331 lb (150.1 kg)     Past Medical History:  Diagnosis Date  . Hyperlipidemia   . Hypertension     Past Surgical History:  Procedure Laterality Date  . CORONARY STENT INTERVENTION N/A 02/13/2020   Procedure: CORONARY STENT INTERVENTION;  Surgeon: Martinique, Peter M, MD;  Location: [place]  [facility];  Service: Cardiovascular;  Laterality: N/A;  . CYST EXCISION    . LEFT HEART CATH AND CORONARY ANGIOGRAPHY N/A 02/13/2020   Procedure: LEFT HEART CATH AND CORONARY ANGIOGRAPHY;  Surgeon: Martinique, Peter M, MD;  Location: [place] [facility];  Service: Cardiovascular;  Laterality: N/A;  . MIDDLE EAR SURGERY      Current Medications: Current Meds  Medication Sig  . aspirin EC 81 MG tablet Take 1 tablet (81 mg total) by mouth daily. Swallow whole.  Marland Kitchen atorvastatin (LIPITOR) 80 MG tablet Take 1 tablet (80 mg total) by mouth daily.  . carvedilol (COREG) 12.5 MG tablet Take 1 tablet (12.5 mg total) by mouth 2 (two) times daily.  . nitroGLYCERIN (NITROSTAT) 0.4 MG SL tablet Place 1 tablet (0.4 mg total) under the tongue every 5 (five) minutes as needed for chest pain.  . ticagrelor (BRILINTA) 90 MG TABS tablet Take 1 tablet (90 mg total) by mouth 2 (two) times daily.  . [DISCONTINUED] amLODipine (NORVASC) 10 MG tablet Take 1 tablet (10 mg total) by mouth daily.  . [DISCONTINUED] hydrochlorothiazide (HYDRODIURIL) 25 MG tablet Take 1 tablet (25 mg total) by mouth daily.  . [DISCONTINUED] metoprolol succinate (TOPROL-XL) 100 MG 24 hr tablet TAKE 1 TABLET BY MOUTH EVERY DAY  . [DISCONTINUED] ramipril (ALTACE) 10 MG capsule Take 1 capsule (10 mg total) by mouth 2 (two) times daily.  . [DISCONTINUED] spironolactone (ALDACTONE) 25 MG tablet Take 1 tablet (25 mg total) by mouth daily.  Allergies:   Patient has no known allergies.   Social History   Socioeconomic History  . Marital status: Married    Spouse name: Not on file  . Number of children: Not on file  . Years of education: Not on file  . Highest education level: Not on file  Occupational History  . Not on file  Tobacco Use  . Smoking status: Never Smoker  . Smokeless tobacco: Never Used  Substance and Sexual Activity  . Alcohol use: Yes    Alcohol/week: 12.0 standard drinks    Types: 12 Cans of beer per week  . Drug use: No   . Sexual activity: Yes  Other Topics Concern  . Not on file  Social History Narrative  . Not on file   Social Determinants of Health   Financial Resource Strain: Not on file  Food Insecurity: Not on file  Transportation Needs: Not on file  Physical Activity: Not on file  Stress: Not on file  Social Connections: Not on file     Family History: The patient's family history includes Heart disease in his father.  ROS:   Please see the history of present illness.     All other systems reviewed and are negative.  EKGs/Labs/Other Studies Reviewed:    The following studies were reviewed today:   EKG:  EKG is ordered today.  The ekg ordered today demonstrates normal sinus rhythm, rate 80, no ST/T abnormalities  Recent Labs: 02/01/2020: ALT 31; BUN 17; Creatinine, Ser 0.93; Hemoglobin 15.3; Platelets 209; Potassium 4.4; Sodium 136; TSH 0.896  Recent Lipid Panel    Component Value Date/Time   CHOL 170 02/01/2020 0929   CHOL 212 (H) 06/22/2012 0946   TRIG 102 02/01/2020 0929   TRIG 174 (H) 01/31/2013 1216   TRIG 136 06/22/2012 0946   HDL 44 02/01/2020 0929   HDL 51 01/31/2013 1216   HDL 49 06/22/2012 0946   CHOLHDL 3.9 02/01/2020 0929   LDLCALC 107 (H) 02/01/2020 0929   LDLCALC 107 (H) 01/31/2013 1216   LDLCALC 136 (H) 06/22/2012 0946    Physical Exam:    VS:  BP 132/83   Pulse 80   Ht 6\' 2"  (1.88 m)   Wt (!) 312 lb (141.5 kg)   SpO2 98%   BMI 40.06 kg/m     Wt Readings from Last 3 Encounters:  03/20/20 (!) 312 lb (141.5 kg)  02/13/20 (!) 330 lb (149.7 kg)  02/01/20 (!) 331 lb (150.1 kg)     GEN:  in no acute distress HEENT: Normal NECK: No JVD; No carotid bruits CARDIAC: RRR, no murmurs, rubs, gallops RESPIRATORY:  Clear to auscultation without rales, wheezing or rhonchi  ABDOMEN: Soft, non-tender, non-distended MUSCULOSKELETAL:  No edema; No deformity  SKIN: Warm and dry NEUROLOGIC:  Alert and oriented x 3 PSYCHIATRIC:  Normal affect   ASSESSMENT:     1. CAD in native artery   2. Aortic dilatation (HCC)   3. Pre-procedure lab exam   4. Primary hypertension   5. Snoring    PLAN:     CAD: Reported symptoms consistent with typical angina.  Cardiac catheterization on 02/13/2020 showed severe two-vessel obstructive CAD (85% proximal LCx stenosis, CTO of mid RCA with left-to-right collaterals, 65% ramus, 35% proximal to mid LAD, aneurysmal dilatation of distal left main), normal LV function, mild elevation in LVEDP 20 mmHg.  Treated with DES to proximal LCx.  Echocardiogram 03/16/2020 shows normal biventricular function.  Reports chest pain resolved since his  catheterization. -Continue aspirin 81 mg daily, ticagrelor 90 mg twice daily.  Plan DAPT for 1 year through 02/2021 -Continue atorvastatin 80 mg daily -Continue beta-blocker, will switch from Toprol-XL to carvedilol for better BP control -As needed sublingual nitroglycerin  Hypertension: On amlodipine 10 mg daily, hydrochlorothiazide 25 mg daily, Toprol-XL 100 mg daily, ramipril 10 mg twice daily, spironolactone 25 mg daily.  Suspect untreated OSA contributing to resistant hypertension.  Normal aldosterone but elevated renin, will consider ultrasound to evaluate for renal artery stenosis if BP not improved.  Will switch from Toprol-XL to carvedilol 12.5 mg twice daily for better BP control.  Asked patient to check BP daily for next 2 weeks and call with results.  Hyperlipidemia: LDL 107 on 02/01/2020.  Switched from simvastatin 40 mg daily to atorvastatin 80 mg daily for goal LDL less than 70.  Snoring: Sleep study as above  Aortic dilatation: Aortic root measured 41 mm on echocardiogram.  Will check CTA chest   RTC in 3 months   Medication Adjustments/Labs and Tests Ordered: Current medicines are reviewed at length with the patient today.  Concerns regarding medicines are outlined above.  Orders Placed This Encounter  Procedures  . CT ANGIO CHEST AORTA W/CM & OR WO/CM  . Basic  metabolic panel  . EKG 12-Lead   Meds ordered this encounter  Medications  . carvedilol (COREG) 12.5 MG tablet    Sig: Take 1 tablet (12.5 mg total) by mouth 2 (two) times daily.    Dispense:  180 tablet    Refill:  3    STOP metoprolol  . hydrochlorothiazide (HYDRODIURIL) 25 MG tablet    Sig: Take 1 tablet (25 mg total) by mouth daily.    Dispense:  90 tablet    Refill:  3  . amLODipine (NORVASC) 10 MG tablet    Sig: Take 1 tablet (10 mg total) by mouth daily.    Dispense:  90 tablet    Refill:  3  . ramipril (ALTACE) 10 MG capsule    Sig: Take 1 capsule (10 mg total) by mouth 2 (two) times daily.    Dispense:  180 capsule    Refill:  3  . spironolactone (ALDACTONE) 25 MG tablet    Sig: Take 1 tablet (25 mg total) by mouth daily.    Dispense:  90 tablet    Refill:  3    Patient Instructions  Medication Instructions:  STOP metoprolol succinate (Toprol XL) START carvedilol (Coreg) 12.5 mg two times daily  Please check your blood pressure at home daily, write it down.  Call the office or send message via Mychart with the readings in 2 weeks for Dr. Gardiner Rhyme to review.   *If you need a refill on your cardiac medications before your next appointment, please call your pharmacy*   Lab Work: BMET prior to CTA   Testing/Procedures: CTA chest/aorta  Follow-Up: At [facility], you and your health needs are our priority.  As part of our continuing mission to provide you with exceptional heart care, we have created designated Provider Care Teams.  These Care Teams include your primary Cardiologist (physician) and Advanced Practice Providers (APPs -  Physician Assistants and Nurse Practitioners) who all work together to provide you with the care you need, when you need it.  We recommend signing up for the patient portal called "MyChart".  Sign up information is provided on this After Visit Summary.  MyChart is used to connect with patients for Virtual Visits (Telemedicine).  Patients are able to view lab/test results, encounter notes, upcoming appointments, etc.  Non-urgent messages can be sent to your provider as well.   To learn more about what you can do with MyChart, go to [email or web address].    Your next appointment:   3 month(s)  The format for your next appointment:   In Person  Provider:   Oswaldo Milian, MD       Signed, Donato Heinz, MD  03/20/2020 6:21 PM    [place]

## 2020-03-20 ENCOUNTER — Ambulatory Visit (INDEPENDENT_AMBULATORY_CARE_PROVIDER_SITE_OTHER): Payer: Managed Care, Other (non HMO) | Admitting: Cardiology

## 2020-03-20 ENCOUNTER — Encounter: Payer: Self-pay | Admitting: Cardiology

## 2020-03-20 ENCOUNTER — Other Ambulatory Visit: Payer: Self-pay

## 2020-03-20 VITALS — BP 132/83 | HR 80 | Ht 74.0 in | Wt 312.0 lb

## 2020-03-20 DIAGNOSIS — I2510 Atherosclerotic heart disease of native coronary artery without angina pectoris: Secondary | ICD-10-CM | POA: Diagnosis not present

## 2020-03-20 DIAGNOSIS — Z01812 Encounter for preprocedural laboratory examination: Secondary | ICD-10-CM | POA: Diagnosis not present

## 2020-03-20 DIAGNOSIS — I10 Essential (primary) hypertension: Secondary | ICD-10-CM

## 2020-03-20 DIAGNOSIS — R0683 Snoring: Secondary | ICD-10-CM

## 2020-03-20 DIAGNOSIS — I77819 Aortic ectasia, unspecified site: Secondary | ICD-10-CM | POA: Diagnosis not present

## 2020-03-20 MED ORDER — RAMIPRIL 10 MG PO CAPS: 10.0000 mg | ORAL_CAPSULE | Freq: Two times a day (BID) | ORAL | 3 refills | Status: DC

## 2020-03-20 MED ORDER — CARVEDILOL 12.5 MG PO TABS: 12.5000 mg | ORAL_TABLET | Freq: Two times a day (BID) | ORAL | 3 refills | Status: DC

## 2020-03-20 MED ORDER — SPIRONOLACTONE 25 MG PO TABS: 25.0000 mg | ORAL_TABLET | Freq: Every day | ORAL | 3 refills | Status: DC

## 2020-03-20 MED ORDER — AMLODIPINE BESYLATE 10 MG PO TABS: 10.0000 mg | ORAL_TABLET | Freq: Every day | ORAL | 3 refills | Status: DC

## 2020-03-20 MED ORDER — HYDROCHLOROTHIAZIDE 25 MG PO TABS: 25.0000 mg | ORAL_TABLET | Freq: Every day | ORAL | 3 refills | Status: DC

## 2020-03-20 NOTE — Patient Instructions (Signed)
Medication Instructions:  STOP metoprolol succinate (Toprol XL) START carvedilol (Coreg) 12.5 mg two times daily  Please check your blood pressure at home daily, write it down.  Call the office or send message via Mychart with the readings in 2 weeks for Dr. Gardiner Rhyme to review.   *If you need a refill on your cardiac medications before your next appointment, please call your pharmacy*   Lab Work: BMET prior to CTA   Testing/Procedures: CTA chest/aorta  Follow-Up: At [facility], you and your health needs are our priority.  As part of our continuing mission to provide you with exceptional heart care, we have created designated Provider Care Teams.  These Care Teams include your primary Cardiologist (physician) and Advanced Practice Providers (APPs -  Physician Assistants and Nurse Practitioners) who all work together to provide you with the care you need, when you need it.  We recommend signing up for the patient portal called "MyChart".  Sign up information is provided on this After Visit Summary.  MyChart is used to connect with patients for Virtual Visits (Telemedicine).  Patients are able to view lab/test results, encounter notes, upcoming appointments, etc.  Non-urgent messages can be sent to your provider as well.   To learn more about what you can do with MyChart, go to [email or web address].    Your next appointment:   3 month(s)  The format for your next appointment:   In Person  Provider:   Oswaldo Milian, MD

## 2020-03-21 ENCOUNTER — Telehealth: Payer: Self-pay | Admitting: Cardiology

## 2020-03-21 LAB — BASIC METABOLIC PANEL
BUN/Creatinine Ratio: 15 (ref 9–20)
BUN: 16 mg/dL (ref 6–24)
CO2: 21 mmol/L (ref 20–29)
Calcium: 10.2 mg/dL (ref 8.7–10.2)
Chloride: 98 mmol/L (ref 96–106)
Creatinine, Ser: 1.09 mg/dL (ref 0.76–1.27)
GFR calc Af Amer: 88 mL/min/{1.73_m2} (ref >59)
GFR calc non Af Amer: 76 mL/min/{1.73_m2} (ref >59)
Glucose: 99 mg/dL (ref 65–99)
Potassium: 4.5 mmol/L (ref 3.5–5.2)
Sodium: 137 mmol/L (ref 134–144)

## 2020-03-21 NOTE — Telephone Encounter (Signed)
Left message for patien to call and discuss scheduling the CTA chest/aorta ordered by Dr. Gardiner Rhyme

## 2020-03-21 NOTE — Telephone Encounter (Signed)
Spoke with patient regarding appointment scheduled Monday 03/26/20 at 1:00pm at [place] for the CTA chest/aorta ordered by Dr. Malachi Bonds time is 12:45 pm--[address], [address] for check in.  Liquids only 4 hours prior to study.  Patient voiced his understanding and information  Is available in My Chart.

## 2020-03-26 ENCOUNTER — Other Ambulatory Visit: Payer: Self-pay

## 2020-03-26 ENCOUNTER — Ambulatory Visit (INDEPENDENT_AMBULATORY_CARE_PROVIDER_SITE_OTHER)
Admission: RE | Admit: 2020-03-26 | Discharge: 2020-03-26 | Disposition: A | Discharge status: Home / Self Care | Payer: Managed Care, Other (non HMO) | Source: Ambulatory Visit | Attending: Cardiology | Admitting: Cardiology

## 2020-03-26 DIAGNOSIS — I77819 Aortic ectasia, unspecified site: Secondary | ICD-10-CM | POA: Diagnosis not present

## 2020-03-26 MED ORDER — IOHEXOL 350 MG/ML SOLN
100.0000 mL | Freq: Once | INTRAVENOUS | Status: AC | PRN
  Administered 2020-03-26: 100 mL via INTRAVENOUS

## 2020-03-27 ENCOUNTER — Telehealth: Payer: Self-pay | Admitting: Cardiology

## 2020-03-27 ENCOUNTER — Other Ambulatory Visit: Payer: Self-pay | Admitting: *Deleted

## 2020-03-27 NOTE — Telephone Encounter (Signed)
appt scheduled with [facility] Monday 2/28 at 10 AM

## 2020-03-27 NOTE — Telephone Encounter (Signed)
Tracy with [facility] is calling to report abnormal CT results. 

## 2020-03-27 NOTE — Telephone Encounter (Signed)
Received a call from [facility] with [facility] calling to report chest cta revealed a mass in right kidney.Advised results are in epic.I will send message to [place].

## 2020-03-28 ENCOUNTER — Telehealth (HOSPITAL_COMMUNITY): Payer: Self-pay

## 2020-03-28 NOTE — Telephone Encounter (Signed)
Called and spoke with pt in regards to CR, pt stated he is not interested at this time.  Closed referrral

## 2020-04-06 ENCOUNTER — Encounter (HOSPITAL_BASED_OUTPATIENT_CLINIC_OR_DEPARTMENT_OTHER): Payer: Managed Care, Other (non HMO) | Admitting: Cardiovascular Disease

## 2020-05-07 ENCOUNTER — Telehealth: Payer: Self-pay | Admitting: Cardiology

## 2020-05-07 NOTE — Telephone Encounter (Signed)
Pre-op covering staff, can you please let the requesting surgeon's office know that patient had cardiac stent placed on 02/13/2020. Therefore, uninterrupted dual antiplatelet therapy needs to be continued for ideally 6 months. There is a high risk of in-stent restenosis if stopped before then. Therefore, I would recommend surgery be postponed to at least after 08/12/2020. Can you please see if this is acceptable for them or is there a reason that it needs to be done sooner?  Thank you! Alexina Niccoli

## 2020-05-07 NOTE — Telephone Encounter (Signed)
   [facility] Pre-operative Risk Assessment    Patient Name: Jonathan Barrett  DOB: 1965-06-28  MRN: [id number]   HEARTCARE STAFF: - Please ensure there is not already an duplicate clearance open for this procedure. - Under Visit Info/Reason for Call, type in Other and utilize the format Clearance MM/DD/YY or Clearance TBD. Do not use dashes or single digits. - If request is for dental extraction, please clarify the # of teeth to be extracted.  Request for surgical clearance:  1. What type of surgery is being performed? Right robotic assisted laparoscopic partial nephrectomy  2. When is this surgery scheduled? TBD  3. What type of clearance is required (medical clearance vs. Pharmacy clearance to hold med vs. Both)? Both  4. Are there any medications that need to be held prior to surgery and how long? Brilinta for 5 days prior, okay to continue aspirin  5. Practice name and name of physician performing surgery? Dr Raynelle Bring  6. What is the office phone number? [place]   7.   What is the office fax number? [phone]  8.   Anesthesia type (None, local, MAC, general) ? General   Jonathan Barrett 05/07/2020, 12:56 PM  _________________________________________________________________   (provider comments below)

## 2020-05-07 NOTE — Telephone Encounter (Signed)
Call and spoke with nurse at [facility] Urology Dr Alinda Money office to let them know pt can not be off his Brilinta for at least 6 month post stent, nurse stated she will send message to Dr Alinda Money and will give our office a call back

## 2020-05-08 NOTE — Telephone Encounter (Signed)
I s/w Marlowe Kays with [facility] in regards to call yesterday. Per Marlowe Kays, states that Dr. Alinda Money s/w Dr.Schuman and that Dr. Gilman Schmidt gave recommendations ok for procedure to be done after 3 months of PCI which will be after 05/13/20 though continue on ASA. I thanked Marlowe Kays for her help and that I will update the pre op provider and we will fax notes once completed.

## 2020-05-08 NOTE — Telephone Encounter (Signed)
Hi Dr. Gardiner Rhyme,  Mr. Jonathan Barrett has upcoming partial nephrectomy planned. He has history of CAD with recent DES to LCX on 02/13/2020. You last saw him in 03/2020 and he was doing well after PCI without any further chest pain and dyspnea. Are you OK with patient holding Brilinta for 5 days once he is at least 3 months out from PCI or do you recommend waiting 6 months?  Please route response back to [facility] PREOP.  Thank you! Lakina Mcintire

## 2020-05-09 NOTE — Telephone Encounter (Signed)
Yes I spoke with Dr Alinda Money about this, also with Dr Martinique who did the PCI.  Agreement was OK to hold ticagrelor after 3 months of DAPT and proceed with surgery on aspirin

## 2020-05-09 NOTE — Telephone Encounter (Addendum)
   Patient Name: Jonathan Barrett  DOB: Apr 07, 1965  MRN: [id number]   Primary Cardiologist: Donato Heinz, MD  Chart reviewed as part of pre-operative protocol coverage. Patient has upcoming partial nephrectomy planned. He has history of CAD with recent DES to LCX on 02/13/2020. H was last seen by Dr. Gardiner Rhyme in 03/2020 at which time patient was doing well after PCI without any further chest pain and dyspnea. Patient was contacted today for further pre-op evaluation and reports he has done very well since last visit. No chest pain, shortness of breath, or syncope. He is staying active and able to complete >4.0 METS without any anginal symptoms. Given past medical history and time since last visit, based on ACC/AHA guidelines, Jonathan Barrett would be at acceptable risk for the planned procedure without further cardiovascular testing.   Confirmed with Dr. Gardiner Rhyme who also discussed with Dr. Martinique who performed PCI - OK to hold Brilinta for 5 days after patient has completed 3 months of dual antiplatelet there (at least through 05/13/2020). Aspirin should be continued perioperatively. Please restart Brilinta as soon as safely possible postoperatively.   I will route this recommendation to the requesting party via Epic fax function and remove from pre-op pool.  Please call with questions.  Darreld Mclean, PA-C 05/09/2020, 8:57 AM

## 2020-05-10 ENCOUNTER — Encounter: Payer: Self-pay | Admitting: Family Medicine

## 2020-05-10 ENCOUNTER — Ambulatory Visit: Payer: Managed Care, Other (non HMO) | Admitting: Family Medicine

## 2020-05-10 ENCOUNTER — Other Ambulatory Visit: Payer: Self-pay

## 2020-05-10 VITALS — BP 127/87 | HR 85 | Ht 74.0 in | Wt 304.0 lb

## 2020-05-10 DIAGNOSIS — D2210 Melanocytic nevi of unspecified eyelid, including canthus: Secondary | ICD-10-CM | POA: Diagnosis not present

## 2020-05-10 NOTE — Progress Notes (Signed)
BP 127/87   Pulse 85   Ht 6\' 2"  (1.88 m)   Wt (!) 304 lb (137.9 kg)   SpO2 95%   BMI 39.03 kg/m    Subjective:   Patient ID: Jonathan Barrett, male    DOB: April 03, 1965, 55 y.o.   MRN: [id number]  HPI: Jonathan Barrett is a 55 y.o. male presenting on 05/10/2020 for Skin growth (Right cheek. Present for at least a year. It is raised and enlarging. Scratched and has become irritated.)   HPI Patient comes in today complaining of an irritated skin lesion on his right cheek that is been bothering him.  He says is been there for at least a year but is becoming larger and more raised and now he is catching it on things and then it will bleed.  He says his granddaughter accidentally scratched to the other day and now he gets irritated easily and bleeds easily.  He wants it removed.  Relevant past medical, surgical, family and social history reviewed and updated as indicated. Interim medical history since our last visit reviewed. Allergies and medications reviewed and updated.  Review of Systems  Constitutional: Negative for chills and fever.  Cardiovascular: Negative for chest pain and leg swelling.  Musculoskeletal: Negative for back pain and gait problem.  Skin: Negative for rash.  All other systems reviewed and are negative.   Per HPI unless specifically indicated above   Allergies as of 05/10/2020   No Known Allergies     Medication List       Accurate as of May 10, 2020 11:35 AM. If you have any questions, ask your nurse or doctor.        amLODipine 10 MG tablet Commonly known as: NORVASC Take 1 tablet (10 mg total) by mouth daily.   aspirin EC 81 MG tablet Take 1 tablet (81 mg total) by mouth daily. Swallow whole.   atorvastatin 80 MG tablet Commonly known as: Lipitor Take 1 tablet (80 mg total) by mouth daily.   carvedilol 12.5 MG tablet Commonly known as: COREG Take 1 tablet (12.5 mg total) by mouth 2 (two) times daily.   hydrochlorothiazide 25 MG tablet Commonly  known as: HYDRODIURIL Take 1 tablet (25 mg total) by mouth daily.   nitroGLYCERIN 0.4 MG SL tablet Commonly known as: NITROSTAT Place 1 tablet (0.4 mg total) under the tongue every 5 (five) minutes as needed for chest pain.   ramipril 10 MG capsule Commonly known as: ALTACE Take 1 capsule (10 mg total) by mouth 2 (two) times daily.   spironolactone 25 MG tablet Commonly known as: ALDACTONE Take 1 tablet (25 mg total) by mouth daily.   ticagrelor 90 MG Tabs tablet Commonly known as: BRILINTA Take 1 tablet (90 mg total) by mouth 2 (two) times daily.        Objective:   BP 127/87   Pulse 85   Ht 6\' 2"  (1.88 m)   Wt (!) 304 lb (137.9 kg)   SpO2 95%   BMI 39.03 kg/m   Wt Readings from Last 3 Encounters:  05/10/20 (!) 304 lb (137.9 kg)  03/20/20 (!) 312 lb (141.5 kg)  02/13/20 (!) 330 lb (149.7 kg)    Physical Exam Vitals and nursing note reviewed.  Constitutional:      Appearance: Normal appearance.  Skin:    Findings: Lesion (Skin colored nevus on right lower eyelid, 0.15 cm in diameter, can see where irritated and has bled previously, scabbed over) present.  Neurological:  Mental Status: He is alert.       Assessment & Plan:   Problem List Items Addressed This Visit   None   Visit Diagnoses    Irritated nevus of eyelid, right    -  Primary   Relevant Orders   Ambulatory referral to Dermatology      Due to location on the eyelid and proximity will send to dermatology. Follow up plan: Return if symptoms worsen or fail to improve.  Counseling provided for all of the vaccine components No orders of the defined types were placed in this encounter.   Caryl Pina, MD [facility] 05/10/2020, 11:35 AM

## 2020-05-17 ENCOUNTER — Other Ambulatory Visit: Payer: Self-pay | Admitting: Urology

## 2020-05-30 NOTE — Patient Instructions (Signed)
DUE TO COVID-19 ONLY ONE VISITOR IS ALLOWED TO COME WITH YOU AND STAY IN THE WAITING ROOM ONLY DURING PRE OP AND PROCEDURE DAY OF SURGERY. THE 1 VISITOR  MAY VISIT WITH YOU AFTER SURGERY IN YOUR PRIVATE ROOM DURING VISITING HOURS ONLY!  YOU NEED TO HAVE A COVID 19 TEST ON: 05/31/20 @ 2:50 PM, THIS TEST MUST BE DONE BEFORE SURGERY,  COVID TESTING SITE [place] [address], IT IS ON THE RIGHT GOING OUT [address] APPROXIMATELY  2 MINUTES PAST [employer] ON THE RIGHT. ONCE YOUR COVID TEST IS COMPLETED,  PLEASE BEGIN THE QUARANTINE INSTRUCTIONS AS OUTLINED IN YOUR HANDOUT.                Jonathan Barrett    Your procedure is scheduled on: 06/04/20    Report to [facility] Main  Entrance   Report to short stay at: 5:15 AM     Call this number if you have problems the morning of surgery [phone]    Remember: Do not eat food or drink liquids :After Midnight.   BRUSH YOUR TEETH MORNING OF SURGERY AND RINSE YOUR MOUTH OUT, NO CHEWING GUM CANDY OR MINTS.    Take these medicines the morning of surgery with A SIP OF WATER: carvedilol,amlodipine.                                You may not have any metal on your body including hair pins and              piercings  Do not wear jewelry, lotions, powders or perfumes, deodorant             Men may shave face and neck.   Do not bring valuables to the hospital. [place].  Contacts, dentures or bridgework may not be worn into surgery.  Leave suitcase in the car. After surgery it may be brought to your room.     Patients discharged the day of surgery will not be allowed to drive home. IF YOU ARE HAVING SURGERY AND GOING HOME THE SAME DAY, YOU MUST HAVE AN ADULT TO DRIVE YOU HOME AND BE WITH YOU FOR 24 HOURS. YOU MAY GO HOME BY TAXI OR UBER OR ORTHERWISE, BUT AN ADULT MUST ACCOMPANY YOU HOME AND STAY WITH YOU FOR 24 HOURS.  Name and phone number of your  driver:  Special Instructions: N/A              Please read over the following fact sheets you were given: _____________________________________________________________________          [facility] - Preparing for Surgery Before surgery, you can play an important role.  Because skin is not sterile, your skin needs to be as free of germs as possible.  You can reduce the number of germs on your skin by washing with CHG (chlorahexidine gluconate) soap before surgery.  CHG is an antiseptic cleaner which kills germs and bonds with the skin to continue killing germs even after washing. Please DO NOT use if you have an allergy to CHG or antibacterial soaps.  If your skin becomes reddened/irritated stop using the CHG and inform your nurse when you arrive at Short Stay. Do not shave (including legs and underarms) for at least 48 hours prior to  the first CHG shower.  You may shave your face/neck. Please follow these instructions carefully:  1.  Shower with CHG Soap the night before surgery and the  morning of Surgery.  2.  If you choose to wash your hair, wash your hair first as usual with your  normal  shampoo.  3.  After you shampoo, rinse your hair and body thoroughly to remove the  shampoo.                           4.  Use CHG as you would any other liquid soap.  You can apply chg directly  to the skin and wash                       Gently with a scrungie or clean washcloth.  5.  Apply the CHG Soap to your body ONLY FROM THE NECK DOWN.   Do not use on face/ open                           Wound or open sores. Avoid contact with eyes, ears mouth and genitals (private parts).                       Wash face,  Genitals (private parts) with your normal soap.             6.  Wash thoroughly, paying special attention to the area where your surgery  will be performed.  7.  Thoroughly rinse your body with warm water from the neck down.  8.  DO NOT shower/wash with your normal soap after using and rinsing off   the CHG Soap.                9.  Pat yourself dry with a clean towel.            10.  Wear clean pajamas.            11.  Place clean sheets on your bed the night of your first shower and do not  sleep with pets. Day of Surgery : Do not apply any lotions/deodorants the morning of surgery.  Please wear clean clothes to the hospital/surgery center.  FAILURE TO FOLLOW THESE INSTRUCTIONS MAY RESULT IN THE CANCELLATION OF YOUR SURGERY PATIENT SIGNATURE_________________________________  NURSE SIGNATURE__________________________________  ________________________________________________________________________

## 2020-05-31 ENCOUNTER — Other Ambulatory Visit: Payer: Self-pay

## 2020-05-31 ENCOUNTER — Encounter (HOSPITAL_COMMUNITY)
Admission: RE | Admit: 2020-05-31 | Discharge: 2020-05-31 | Disposition: A | Discharge status: Home / Self Care | Payer: Managed Care, Other (non HMO) | Source: Ambulatory Visit | Attending: Urology | Admitting: Urology

## 2020-05-31 ENCOUNTER — Other Ambulatory Visit (HOSPITAL_COMMUNITY)
Admission: RE | Admit: 2020-05-31 | Discharge: 2020-05-31 | Disposition: A | Discharge status: Home / Self Care | Payer: Managed Care, Other (non HMO) | Source: Ambulatory Visit | Attending: Urology | Admitting: Urology

## 2020-05-31 ENCOUNTER — Encounter (HOSPITAL_COMMUNITY): Payer: Self-pay

## 2020-05-31 DIAGNOSIS — Z79899 Other long term (current) drug therapy: Secondary | ICD-10-CM | POA: Diagnosis not present

## 2020-05-31 DIAGNOSIS — Z20822 Contact with and (suspected) exposure to covid-19: Secondary | ICD-10-CM | POA: Insufficient documentation

## 2020-05-31 DIAGNOSIS — C641 Malignant neoplasm of right kidney, except renal pelvis: Secondary | ICD-10-CM | POA: Insufficient documentation

## 2020-05-31 DIAGNOSIS — Z7982 Long term (current) use of aspirin: Secondary | ICD-10-CM | POA: Insufficient documentation

## 2020-05-31 DIAGNOSIS — I2510 Atherosclerotic heart disease of native coronary artery without angina pectoris: Secondary | ICD-10-CM | POA: Diagnosis not present

## 2020-05-31 DIAGNOSIS — Z955 Presence of coronary angioplasty implant and graft: Secondary | ICD-10-CM | POA: Insufficient documentation

## 2020-05-31 DIAGNOSIS — Z01812 Encounter for preprocedural laboratory examination: Secondary | ICD-10-CM | POA: Insufficient documentation

## 2020-05-31 DIAGNOSIS — I10 Essential (primary) hypertension: Secondary | ICD-10-CM | POA: Insufficient documentation

## 2020-05-31 HISTORY — DX: Atherosclerotic heart disease of native coronary artery without angina pectoris: I25.10

## 2020-05-31 LAB — CBC
HCT: 42.2 % (ref 39.0–52.0)
Hemoglobin: 14.3 g/dL (ref 13.0–17.0)
MCH: 31.4 pg (ref 26.0–34.0)
MCHC: 33.9 g/dL (ref 30.0–36.0)
MCV: 92.7 fL (ref 80.0–100.0)
Platelets: 236 10*3/uL (ref 150–400)
RBC: 4.55 MIL/uL (ref 4.22–5.81)
RDW: 12.7 % (ref 11.5–15.5)
WBC: 12.7 10*3/uL — ABNORMAL HIGH (ref 4.0–10.5)
nRBC: 0 % (ref 0.0–0.2)

## 2020-05-31 LAB — BASIC METABOLIC PANEL
Anion gap: 10 (ref 5–15)
BUN: 16 mg/dL (ref 6–20)
CO2: 23 mmol/L (ref 22–32)
Calcium: 9.2 mg/dL (ref 8.9–10.3)
Chloride: 103 mmol/L (ref 98–111)
Creatinine, Ser: 0.95 mg/dL (ref 0.61–1.24)
GFR, Estimated: >60 mL/min (ref >60)
Glucose, Bld: 91 mg/dL (ref 70–99)
Potassium: 4 mmol/L (ref 3.5–5.1)
Sodium: 136 mmol/L (ref 135–145)

## 2020-05-31 LAB — SARS CORONAVIRUS 2 (TAT 6-24 HRS): SARS Coronavirus 2: NEGATIVE

## 2020-05-31 NOTE — H&P (Signed)
CC: Right renal neoplasm    Jonathan Barrett is a 55 year old gentleman who was having some tightness in his left shoulder. He was evaluated by cardiology and eventually underwent a cardiac catheterization in January. This did reveal significant coronary artery disease and he underwent placement of a drug-eluting stent on 02/13/2020. In the course of his evaluation, he was also noted to have dilation of his aorta on his echocardiogram prompting a CT angiogram. This revealed mild dilation of the aortic root with a 4 cm ascending thoracic aortic aneurysm. In addition, he was incidentally found to have a 4.1 cm right renal neoplasm on a CTA of the chest on 03/26/20. This prompted a dedicated CT scan of the abdomen with and without contrast that confirmed an enhancing 4.9 cm of the medial upper pole of the right kidney concerning for renal malignancy. No regional lymphadenopathy or other evidence of metastatic disease was noted. He has no family history of kidney cancer and denies any hematuria or other complaints. He is currently on both aspirin and Brilinta status post cardiac stent placement. His cardiologist is Dr. Oswaldo Milian.   Family history of kidney cancer: None.  Family history of ESRD: None.   Imaging: CT abdomen (04/19/20)  Side of renal neoplasm: Right  Size of renal neoplasm: 4.9 cm  Location of renal neoplasm: Right medial upper pole posteriorly  Exophytic or endophytic: partially both  Renal nephrometry score: 10x   Renal artery anatomy: Single renal artery  Renal vein anatomy: Single renal vein   Contralateral renal lesions: Simple cysts  Regional lymphadenopathy: None.  Adrenal masses: None.  Renal vein/IVC involvement: No.  Metastatic disease to the abdomen: No.   Chest imaging: Negative  LFTs: Normal.   Baseline renal function: Cr 1.0, eGFR > 60 ml/min   PMH: Past medical history is significant for obesity, CAD s/p drug eluting stent placement on 02/13/20 (on dual  antiplatelet therapy), hypertension, and hyperlipidemia.  PSH: No abdominal surgeries.     ALLERGIES: nkda    MEDICATIONS: Aspirin  Hydrochlorothiazide  Amlodipine Besylate  Atorvastatin Calcium  Brilinta  Carvedilol  Nitroglycerin  Ramipril  Spironolactone     GU PSH: Locm 300-399Mg/Ml Iodine,1Ml - 04/19/2020     NON-GU PSH: Anesth, Catheterize Heart - 03/06/2020 Cardiac Stent Placement - 02/13/2020     GU PMH: Right renal neoplasm - 04/19/2020, - 04/02/2020    NON-GU PMH: Anxiety GERD Gout Heart disease, unspecified Hypercholesterolemia Hypertension    FAMILY HISTORY: 1 Daughter - Runs in Family 1 son - Runs in Family Heart Attack - Mother, Father   SOCIAL HISTORY: Marital Status: Married Ethnicity: Not Hispanic Or Latino; Race: White Current Smoking Status: Patient has never smoked.   Tobacco Use Assessment Completed: Used Tobacco in last 30 days? Does drink.  Drinks 2 caffeinated drinks per day.    REVIEW OF SYSTEMS:    GU Review Male:   Patient denies frequent urination, hard to postpone urination, burning/ pain with urination, get up at night to urinate, leakage of urine, stream starts and stops, trouble starting your streams, and have to strain to urinate .  Gastrointestinal (Lower):   Patient denies diarrhea and constipation.  Gastrointestinal (Upper):   Patient denies nausea and vomiting.  Constitutional:   Patient denies fever, night sweats, weight loss, and fatigue.  Skin:   Patient denies skin rash/ lesion and itching.  Eyes:   Patient denies blurred vision and double vision.  Ears/ Nose/ Throat:   Patient denies sore throat and  sinus problems.  Hematologic/Lymphatic:   Patient denies swollen glands and easy bruising.  Cardiovascular:   Patient denies leg swelling and chest pains.  Respiratory:   Patient denies cough and shortness of breath.  Endocrine:   Patient denies excessive thirst.  Musculoskeletal:   Patient denies joint pain and back pain.   Neurological:   Patient denies headaches and dizziness.  Psychologic:   Patient denies depression and anxiety.   VITAL SIGNS:     Weight 302 lb / 136.98 kg  Height 75 in / 190.5 cm  BMI 37.7 kg/m   MULTI-SYSTEM PHYSICAL EXAMINATION:    Constitutional: Well-nourished. No physical deformities. Normally developed. Good grooming.  Neck: Neck symmetrical, not swollen. Normal tracheal position.  Respiratory: No labored breathing, no use of accessory muscles. Clear bilaterally.  Cardiovascular: Normal temperature, normal extremity pulses, no swelling, no varicosities. Regular rate and rhythm.  Lymphatic: No enlargement of neck, axillae, groin.  Skin: No paleness, no jaundice, no cyanosis. No lesion, no ulcer, no rash.  Neurologic / Psychiatric: Oriented to time, oriented to place, oriented to person. No depression, no anxiety, no agitation.  Gastrointestinal: Obese, soft, nondistended. No abdominal masses.  Eyes: Normal conjunctivae. Normal eyelids.  Ears, Nose, Mouth, and Throat: Left ear no scars, no lesions, no masses. Right ear no scars, no lesions, no masses. Nose no scars, no lesions, no masses. Normal hearing. Normal lips.  Musculoskeletal: Normal gait and station of head and neck.     Complexity of Data:  Lab Test Review:   CMP  X-Ray Review: C.T. Chest: Reviewed Films.  C.T. Abdomen/Pelvis: Reviewed Films.    Notes:                     CLINICAL DATA: Assessment of right renal neoplasm.   EXAM:  CT ABDOMEN WITHOUT AND WITH CONTRAST   TECHNIQUE:  Multidetector CT imaging of the abdomen was performed following the  standard protocol before and following the bolus administration of  intravenous contrast.   CONTRAST: 125 cc of Omnipaque 300   COMPARISON: CT a chest March 26, 2020   FINDINGS:  Lower chest: No acute abnormality.   Hepatobiliary: Hepatic steatosis. No suspicious hepatic lesion.  Gallbladder is unremarkable. No biliary ductal dilation.   Pancreas:  Unremarkable.   Spleen: Unremarkable.   Adrenals/Urinary Tract: Bilateral adrenal glands are unremarkable.   Enhancing 4.9 x 3.3 cm left upper pole renal lesion on image 65/7.   Nonenhancing exophytic low-density 1.3 cm left interpolar renal  lesion on image 64/6, consistent a cyst. Nonenhancing low-density  1.0 cm left lower pole hypodense lesion on image 72/7, consistent  with a cyst.   No hydronephrosis. Symmetric enhancement excretion of contrast in  bilateral kidneys.   Stomach/Bowel: Small hiatal hernia otherwise the stomach is within  normal limits. Appendix appears normal. No evidence of bowel wall  thickening, distention, or inflammatory changes.   Vascular/Lymphatic: Aortic atherosclerosis. No pathologically  enlarged abdominal lymph nodes. Prominent periportal, gastrohepatic  and retroperitoneal lymph nodes. Right renal vein is patent without  evidence tumor in vein.   Other: No abdominal ascites.   Musculoskeletal: No aggressive lytic or blastic lesion of bone.   IMPRESSION:  1. Enhancing 4.9 cm left upper pole renal lesion, consistent with  renal cell carcinoma.  2. No evidence of tumor in vein or abdominal metastases.  3. Prominent periportal, gastrohepatic and retroperitoneal nodes  which are nonspecific and may be reactive. Recommend attention on  follow-up imaging.  4. Hepatic steatosis.  5. Aortic atherosclerosis.   Aortic Atherosclerosis (ICD10-I70.0).    Electronically Signed  By: Dahlia Bailiff MD  On: 04/19/2020 12:47   CLINICAL DATA: Thoracic aortic aneurysm.   EXAM:  CT ANGIOGRAPHY CHEST WITH CONTRAST   TECHNIQUE:  Multidetector CT imaging of the chest was performed using the  standard protocol during bolus administration of intravenous  contrast. Multiplanar CT image reconstructions and MIPs were  obtained to evaluate the vascular anatomy.   CONTRAST: 159m OMNIPAQUE IOHEXOL 350 MG/ML SOLN   COMPARISON: None.   FINDINGS:   Cardiovascular: Aortic root is mildly dilated at 4.4 cm. 4.0 cm  ascending thoracic aortic aneurysm is noted. No dissection is noted.  Transverse tear arch measures 2.7 cm. Proximal descending thoracic  aorta measures 2.8 cm. Great vessels are widely patent without  stenosis. Normal cardiac size. No pericardial effusion. Mild  coronary artery calcifications are noted.   Mediastinum/Nodes: No enlarged mediastinal, hilar, or axillary lymph  nodes. Thyroid gland, trachea, and esophagus demonstrate no  significant findings.   Lungs/Pleura: Lungs are clear. No pleural effusion or pneumothorax.   Upper Abdomen: 4.1 x 3.5 cm irregularly enhancing mass is seen  arising from the midpole of the right kidney consistent with renal  cell carcinoma.   Musculoskeletal: No chest wall abnormality. No acute or significant  osseous findings.   Review of the MIP images confirms the above findings.   IMPRESSION:  1. 4.1 x 3.5 cm irregularly enhancing mass is seen arising from the  midpole of the right kidney consistent with renal cell carcinoma.  Urologic consultation is recommended. These results will be called  to the ordering clinician or representative by the Radiologist  Assistant, and communication documented in the PACS or zVision  Dashboard.  2. Aortic root is mildly dilated at 4.4 cm. 4.0 cm ascending  thoracic aortic aneurysm is noted. No dissection is noted. Recommend  annual imaging followup by CTA or MRA. This recommendation follows  2010 ACCF/AHA/AATS/ACR/ASA/SCA/SCAI/SIR/STS/SVM Guidelines for the  Diagnosis and Management of Patients with Thoracic Aortic Disease.  Circulation. 2010; 121:: U981-X914 Aortic aneurysm NOS  (ICD10-I71.9).  3. Mild coronary artery calcifications are noted.  4. Aortic atherosclerosis.   Aortic Atherosclerosis (ICD10-I70.0).    Electronically Signed  By: JMarijo ConceptionM.D.  On: 03/27/2020 08:43   Serum creatinine 1.0, LFTs are normal.    PROCEDURES:          Urinalysis Dipstick Dipstick Cont'd  Color: Yellow Bilirubin: Neg mg/dL  Appearance: Clear Ketones: Neg mg/dL  Specific Gravity: 1.015 Blood: Neg ery/uL  pH: 6.0 Protein: Neg mg/dL  Glucose: Neg mg/dL Urobilinogen: 0.2 mg/dL    Nitrites: Neg    Leukocyte Esterase: Neg leu/uL    ASSESSMENT:      ICD-10 Details  1 GU:   Right renal neoplasm - D49.511    PLAN:           Schedule Return Visit/Planned Activity: Other See Visit Notes             Note: Will call to schedule surgery.          Document Letter(s):  Created for Patient: Clinical Summary         Notes:   1. Right renal neoplasm concerning for malignancy: I had a detailed discussion with Jonathan Barrett his wife, and his daughter who is a nMarine scientist The patient was provided information regarding their renal mass including the relative risk of benign versus malignant pathology and the natural history of renal cell carcinoma and  other possible malignancies of the kidney. The role of renal biopsy, laboratory testing, and imaging studies to further characterize renal masses and/or the presence of metastatic disease were explained. We discussed the role of active surveillance, surgical therapy with both radical nephrectomy and nephron-sparing surgery, and ablative therapy in the treatment of renal masses. In addition, we discussed our goals of providing an accurate diagnosis and oncologic control while maintaining optimal renal function as appropriate based on the size, location, and complexity of their renal mass as well as their co-morbidities.   We have discussed the risks of treatment in detail including but not limited to bleeding, infection, heart attack, stroke, death, venothromoboembolism, cancer recurrence, injury/damage to surrounding organs and structures, urine leak, the possibility of open surgical conversion for patients undergoing minimally invasive surgery, the risk of developing chronic kidney disease  and its associated implications, and the potential risk of end stage renal disease possibly necessitating dialysis.   I have recommended proceeding with a right robot assisted laparoscopic partial nephrectomy. Due to the complexity of the tumor location, he does understand the potential risk of necessitating total nephrectomy. Complicating his situation, he is on dual anti-platelet therapy after undergoing drug-eluting cardiac stent placement on 02/13/2020. I will plan to reach out to Dr. Gardiner Rhyme to get his advice on when he might be able to stop his Brilinta safely with plans to then proceed on aspirin 81 mg perioperatively. The patient was reassured today and understands that it would likely be okay if he did need another few months of dual anti-platelet therapy. If his surgeries delay, I will have him follow-up preoperatively with plans to go ahead and schedule him in the meantime.     I did communicate with Dr. Gardiner Rhyme. He stated that he would recommend he remain on his dual anti-platelet therapy for 3 months but good then safely stop Brilinta and remain on aspirin 81 mg perioperatively at that time. We will therefore target mid to late April for his surgery.

## 2020-05-31 NOTE — Progress Notes (Addendum)
COVID Vaccine Completed: Yes Date COVID Vaccine completed: 05/13/19 COVID vaccine manufacturer:  [employer]   PCP - Dr. Vonna Kotyk Dettinger. LOV: 05/10/20. Cardiologist - Dr. Oswaldo Milian.: Clearance: 05/07/20: EPIC  Chest x-ray - CT chest: 03/27/20 EKG - 03/20/20 Stress Test -  ECHO - 03/16/20 Cardiac Cath - 02/13/20 Pacemaker/ICD device last checked:  Sleep Study -  CPAP -   Fasting Blood Sugar -  Checks Blood Sugar _____ times a day  Blood Thinner Instructions: Aspirin Instructions: Last Dose:  Anesthesia review:   Patient denies shortness of breath, fever, cough and chest pain at PAT appointment   Patient verbalized understanding of instructions that were given to them at the PAT appointment. Patient was also instructed that they will need to review over the PAT instructions again at home before surgery.

## 2020-06-01 ENCOUNTER — Encounter (HOSPITAL_COMMUNITY): Payer: Self-pay

## 2020-06-01 NOTE — Anesthesia Preprocedure Evaluation (Addendum)
Anesthesia Evaluation  Patient identified by MRN, date of birth, ID band Patient awake    Reviewed: Allergy & Precautions, NPO status , Patient's Chart, lab work & pertinent test results  Airway Mallampati: II  TM Distance: >3 FB     Dental   Pulmonary neg pulmonary ROS,    breath sounds clear to auscultation       Cardiovascular hypertension, + angina + CAD   Rhythm:Regular Rate:Normal     Neuro/Psych negative neurological ROS     GI/Hepatic Neg liver ROS,   Endo/Other  negative endocrine ROS  Renal/GU Hx noted Dr. Nyoka Cowden     Musculoskeletal   Abdominal   Peds  Hematology   Anesthesia Other Findings   Reproductive/Obstetrics                           Anesthesia Physical Anesthesia Plan  ASA: III  Anesthesia Plan: General   Post-op Pain Management:    Induction: Intravenous  PONV Risk Score and Plan: 3 and Ondansetron, Dexamethasone and Midazolam  Airway Management Planned: Oral ETT  Additional Equipment:   Intra-op Plan:   Post-operative Plan: Possible Post-op intubation/ventilation  Informed Consent:     Dental advisory given  Plan Discussed with: Anesthesiologist and CRNA  Anesthesia Plan Comments: (See PAT note 05/31/2020, Konrad Felix, PA-C)      Anesthesia Quick Evaluation

## 2020-06-01 NOTE — Progress Notes (Signed)
Anesthesia Chart Review   Case: [id number] Date/Time: 06/04/20 0700   Procedure: XI ROBOTIC ASSITED PARTIAL NEPHRECTOMY (Right )   Anesthesia type: General   Pre-op diagnosis: RIGHT RENAL NEOPLASM   Location: [facility] ROOM 03 / [facility]   Surgeons: Jonathan Bring, MD      DISCUSSION:55 y.o. never smoker with h/o HTN, CAD (DES to LCX on 02/13/2020), right renal neoplasm scheduled for above procedure 06/04/20 with Dr. Raynelle Barrett.   Per cardiology preoperative evaluation 05/09/2020, "Chart reviewed as part of pre-operative protocol coverage. Patient has upcoming partial nephrectomy planned. He has history of CAD with recent DES to LCX on 02/13/2020. H was last seen by Dr. Gardiner Barrett in 03/2020 at which time patient was doing well after PCI without any further chest pain and dyspnea. Patient was contacted today for further pre-op evaluation and reports he has done very well since last visit. No chest pain, shortness of breath, or syncope. He is staying active and able to complete >4.0 METS without any anginal symptoms. Given past medical history and time since last visit, based on ACC/AHA guidelines, Jonathan Barrett would be at acceptable risk for the planned procedure without further cardiovascular testing.   Confirmed with Dr. Gardiner Barrett who also discussed with Dr. Martinique who performed PCI - OK to hold Brilinta for 5 days after patient has completed 3 months of dual antiplatelet there (at least through 05/13/2020). Aspirin should be continued perioperatively. Please restart Brilinta as soon as safely possible postoperatively."  Anticipate pt can proceed with planned procedure barring acute status change.   VS: BP (!) 141/91   Pulse 89   Temp 37.4 C (Oral)   Ht 6\' 3"  (1.905 m)   Wt (!) 137 kg   SpO2 97%   BMI 37.75 kg/m   PROVIDERS: Jonathan Barrett, Jonathan Kaufmann, MD is PCP   Jonathan Milian, MD is Cardiologist  LABS: Labs reviewed: Acceptable for surgery. (all labs ordered are listed, but only abnormal  results are displayed)  Labs Reviewed  CBC - Abnormal; Notable for the following components:      Result Value   WBC 12.7 (*)    All other components within normal limits  BASIC METABOLIC PANEL  TYPE AND SCREEN     IMAGES:   EKG: 03/20/2020 Rate 80 bpm  NSR  CV: Echo 03/16/20 1. Left ventricular ejection fraction, by estimation, is 60 to 65%. The  left ventricle has normal function. The left ventricle has no regional  wall motion abnormalities. Left ventricular diastolic parameters are  consistent with Grade I diastolic  dysfunction (impaired relaxation).  2. Right ventricular systolic function is normal. The right ventricular  size is normal.  3. Left atrial size was mildly dilated.  4. There is mild dilatation of the aortic root, measuring 42 mm. There is  borderline dilatation of the ascending aorta, measuring 39 mm.  5. Challenging images.  Past Medical History:  Diagnosis Date  . Hyperlipidemia   . Hypertension     Past Surgical History:  Procedure Laterality Date  . CORONARY STENT INTERVENTION N/A 02/13/2020   Procedure: CORONARY STENT INTERVENTION;  Surgeon: Jonathan Barrett, Jonathan M, MD;  Location: [place] [facility];  Service: Cardiovascular;  Laterality: N/A;  . CYST EXCISION    . LEFT HEART CATH AND CORONARY ANGIOGRAPHY N/A 02/13/2020   Procedure: LEFT HEART CATH AND CORONARY ANGIOGRAPHY;  Surgeon: Jonathan Barrett, Jonathan M, MD;  Location: [place] [facility];  Service: Cardiovascular;  Laterality: N/A;  . MIDDLE EAR SURGERY  MEDICATIONS: . amLODipine (NORVASC) 10 MG tablet  . aspirin EC 81 MG tablet  . atorvastatin (LIPITOR) 80 MG tablet  . carvedilol (COREG) 12.5 MG tablet  . hydrochlorothiazide (HYDRODIURIL) 25 MG tablet  . nitroGLYCERIN (NITROSTAT) 0.4 MG SL tablet  . ramipril (ALTACE) 10 MG capsule  . spironolactone (ALDACTONE) 25 MG tablet  . ticagrelor (BRILINTA) 90 MG TABS tablet   No current facility-administered medications for this encounter.     Konrad Felix, PA-C [facility] [phone]

## 2020-06-04 ENCOUNTER — Ambulatory Visit (HOSPITAL_COMMUNITY): Payer: Managed Care, Other (non HMO) | Admitting: Registered Nurse

## 2020-06-04 ENCOUNTER — Other Ambulatory Visit: Payer: Self-pay

## 2020-06-04 ENCOUNTER — Encounter (HOSPITAL_COMMUNITY)
Admission: RE | Disposition: A | Discharge status: Home / Self Care | Payer: Self-pay | Source: Home / Self Care | Attending: Urology

## 2020-06-04 ENCOUNTER — Ambulatory Visit (HOSPITAL_COMMUNITY): Payer: Managed Care, Other (non HMO) | Admitting: Physician Assistant

## 2020-06-04 ENCOUNTER — Observation Stay (HOSPITAL_COMMUNITY)
Admission: RE | Admit: 2020-06-04 | Discharge: 2020-06-06 | Disposition: A | Discharge status: Home / Self Care | Payer: Managed Care, Other (non HMO) | Attending: Urology | Admitting: Urology

## 2020-06-04 ENCOUNTER — Encounter (HOSPITAL_COMMUNITY): Payer: Self-pay | Admitting: Urology

## 2020-06-04 DIAGNOSIS — Z79899 Other long term (current) drug therapy: Secondary | ICD-10-CM | POA: Diagnosis not present

## 2020-06-04 DIAGNOSIS — Z7982 Long term (current) use of aspirin: Secondary | ICD-10-CM | POA: Diagnosis not present

## 2020-06-04 DIAGNOSIS — I2510 Atherosclerotic heart disease of native coronary artery without angina pectoris: Secondary | ICD-10-CM | POA: Insufficient documentation

## 2020-06-04 DIAGNOSIS — I10 Essential (primary) hypertension: Secondary | ICD-10-CM | POA: Diagnosis not present

## 2020-06-04 DIAGNOSIS — D49511 Neoplasm of unspecified behavior of right kidney: Principal | ICD-10-CM | POA: Diagnosis present

## 2020-06-04 HISTORY — PX: ROBOTIC ASSITED PARTIAL NEPHRECTOMY: SHX6087

## 2020-06-04 LAB — BASIC METABOLIC PANEL
Anion gap: 9 (ref 5–15)
BUN: 16 mg/dL (ref 6–20)
CO2: 23 mmol/L (ref 22–32)
Calcium: 8.6 mg/dL — ABNORMAL LOW (ref 8.9–10.3)
Chloride: 103 mmol/L (ref 98–111)
Creatinine, Ser: 1.41 mg/dL — ABNORMAL HIGH (ref 0.61–1.24)
GFR, Estimated: 59 mL/min — ABNORMAL LOW (ref >60)
Glucose, Bld: 173 mg/dL — ABNORMAL HIGH (ref 70–99)
Potassium: 4.5 mmol/L (ref 3.5–5.1)
Sodium: 135 mmol/L (ref 135–145)

## 2020-06-04 LAB — ABO/RH: ABO/RH(D): O POS

## 2020-06-04 LAB — TYPE AND SCREEN
ABO/RH(D): O POS
Antibody Screen: NEGATIVE

## 2020-06-04 LAB — HEMOGLOBIN AND HEMATOCRIT, BLOOD
HCT: 43.7 % (ref 39.0–52.0)
Hemoglobin: 14.5 g/dL (ref 13.0–17.0)

## 2020-06-04 SURGERY — NEPHRECTOMY, PARTIAL, ROBOT-ASSISTED
Anesthesia: General | Laterality: Right

## 2020-06-04 MED ORDER — ORAL CARE MOUTH RINSE: 15.0000 mL | Freq: Once | OROMUCOSAL | Status: AC

## 2020-06-04 MED ORDER — BUPIVACAINE LIPOSOME 1.3 % IJ SUSP
20.0000 mL | Freq: Once | INTRAMUSCULAR | Status: AC
  Administered 2020-06-04: 20 mL
  Filled 2020-06-04: qty 20

## 2020-06-04 MED ORDER — AMLODIPINE BESYLATE 10 MG PO TABS
10.0000 mg | ORAL_TABLET | Freq: Every day | ORAL | Status: DC
  Administered 2020-06-05 – 2020-06-06 (×2): 10 mg via ORAL
  Filled 2020-06-04 (×3): qty 1

## 2020-06-04 MED ORDER — ONDANSETRON HCL 4 MG/2ML IJ SOLN
INTRAMUSCULAR | Status: DC | PRN
  Administered 2020-06-04: 4 mg via INTRAVENOUS

## 2020-06-04 MED ORDER — STERILE WATER FOR IRRIGATION IR SOLN
Status: DC | PRN
  Administered 2020-06-04: 1000 mL

## 2020-06-04 MED ORDER — LIDOCAINE 2% (20 MG/ML) 5 ML SYRINGE
INTRAMUSCULAR | Status: AC
  Filled 2020-06-04: qty 5

## 2020-06-04 MED ORDER — ONDANSETRON HCL 4 MG/2ML IJ SOLN
INTRAMUSCULAR | Status: AC
  Filled 2020-06-04: qty 2

## 2020-06-04 MED ORDER — CHLORHEXIDINE GLUCONATE CLOTH 2 % EX PADS
6.0000 | MEDICATED_PAD | Freq: Every day | CUTANEOUS | Status: DC
  Administered 2020-06-04 – 2020-06-05 (×2): 6 via TOPICAL

## 2020-06-04 MED ORDER — DOCUSATE SODIUM 100 MG PO CAPS: 100.0000 mg | ORAL_CAPSULE | Freq: Two times a day (BID) | ORAL | Status: DC

## 2020-06-04 MED ORDER — FENTANYL CITRATE (PF) 250 MCG/5ML IJ SOLN
INTRAMUSCULAR | Status: AC
  Filled 2020-06-04: qty 5

## 2020-06-04 MED ORDER — ATORVASTATIN CALCIUM 40 MG PO TABS
80.0000 mg | ORAL_TABLET | Freq: Every day | ORAL | Status: DC
  Administered 2020-06-04 – 2020-06-06 (×3): 80 mg via ORAL
  Filled 2020-06-04 (×3): qty 2

## 2020-06-04 MED ORDER — SODIUM CHLORIDE (PF) 0.9 % IJ SOLN
INTRAMUSCULAR | Status: DC | PRN
  Administered 2020-06-04: 20 mL

## 2020-06-04 MED ORDER — CARVEDILOL 12.5 MG PO TABS
12.5000 mg | ORAL_TABLET | Freq: Two times a day (BID) | ORAL | Status: DC
  Administered 2020-06-04 – 2020-06-06 (×5): 12.5 mg via ORAL
  Filled 2020-06-04 (×5): qty 1

## 2020-06-04 MED ORDER — ROCURONIUM BROMIDE 10 MG/ML (PF) SYRINGE
PREFILLED_SYRINGE | INTRAVENOUS | Status: AC
  Filled 2020-06-04: qty 10

## 2020-06-04 MED ORDER — RAMIPRIL 10 MG PO CAPS
10.0000 mg | ORAL_CAPSULE | Freq: Two times a day (BID) | ORAL | Status: DC
  Administered 2020-06-04 – 2020-06-06 (×5): 10 mg via ORAL
  Filled 2020-06-04 (×5): qty 1

## 2020-06-04 MED ORDER — MIDAZOLAM HCL 5 MG/5ML IJ SOLN
INTRAMUSCULAR | Status: DC | PRN
  Administered 2020-06-04: 2 mg via INTRAVENOUS

## 2020-06-04 MED ORDER — MAGNESIUM CITRATE PO SOLN: 0.5000 | Freq: Once | ORAL | Status: DC

## 2020-06-04 MED ORDER — DEXAMETHASONE SODIUM PHOSPHATE 10 MG/ML IJ SOLN
INTRAMUSCULAR | Status: AC
  Filled 2020-06-04: qty 1

## 2020-06-04 MED ORDER — LACTATED RINGERS IV SOLN: INTRAVENOUS | Status: DC

## 2020-06-04 MED ORDER — CHLORHEXIDINE GLUCONATE 0.12 % MT SOLN
15.0000 mL | Freq: Once | OROMUCOSAL | Status: AC
  Administered 2020-06-04: 15 mL via OROMUCOSAL

## 2020-06-04 MED ORDER — PROPOFOL 10 MG/ML IV BOLUS
INTRAVENOUS | Status: DC | PRN
  Administered 2020-06-04: 200 mg via INTRAVENOUS

## 2020-06-04 MED ORDER — HYDROCHLOROTHIAZIDE 25 MG PO TABS
25.0000 mg | ORAL_TABLET | Freq: Every day | ORAL | Status: DC
  Administered 2020-06-04 – 2020-06-06 (×3): 25 mg via ORAL
  Filled 2020-06-04 (×3): qty 1

## 2020-06-04 MED ORDER — PROPOFOL 10 MG/ML IV BOLUS
INTRAVENOUS | Status: AC
  Filled 2020-06-04: qty 40

## 2020-06-04 MED ORDER — SUCCINYLCHOLINE CHLORIDE 200 MG/10ML IV SOSY
PREFILLED_SYRINGE | INTRAVENOUS | Status: DC | PRN
  Administered 2020-06-04: 140 mg via INTRAVENOUS

## 2020-06-04 MED ORDER — DEXTROSE-NACL 5-0.45 % IV SOLN: INTRAVENOUS | Status: DC

## 2020-06-04 MED ORDER — SODIUM CHLORIDE (PF) 0.9 % IJ SOLN
INTRAMUSCULAR | Status: AC
  Filled 2020-06-04: qty 20

## 2020-06-04 MED ORDER — ASPIRIN EC 81 MG PO TBEC
81.0000 mg | DELAYED_RELEASE_TABLET | Freq: Every day | ORAL | Status: DC
  Administered 2020-06-04 – 2020-06-06 (×3): 81 mg via ORAL
  Filled 2020-06-04 (×3): qty 1

## 2020-06-04 MED ORDER — SODIUM CHLORIDE 0.9 % IV SOLN
1.0000 g | Freq: Three times a day (TID) | INTRAVENOUS | Status: AC
  Administered 2020-06-04 – 2020-06-05 (×2): 1 g via INTRAVENOUS
  Filled 2020-06-04 (×2): qty 1

## 2020-06-04 MED ORDER — SUGAMMADEX SODIUM 200 MG/2ML IV SOLN
INTRAVENOUS | Status: DC | PRN
  Administered 2020-06-04: 500 mg via INTRAVENOUS

## 2020-06-04 MED ORDER — CEFAZOLIN SODIUM-DEXTROSE 1-4 GM/50ML-% IV SOLN
1.0000 g | Freq: Three times a day (TID) | INTRAVENOUS | Status: DC
  Filled 2020-06-04: qty 50

## 2020-06-04 MED ORDER — LACTATED RINGERS IR SOLN
Status: DC | PRN
  Administered 2020-06-04: 1000 mL

## 2020-06-04 MED ORDER — DEXAMETHASONE SODIUM PHOSPHATE 10 MG/ML IJ SOLN
INTRAMUSCULAR | Status: DC | PRN
  Administered 2020-06-04: 10 mg via INTRAVENOUS

## 2020-06-04 MED ORDER — DOCUSATE SODIUM 100 MG PO CAPS
100.0000 mg | ORAL_CAPSULE | Freq: Two times a day (BID) | ORAL | Status: DC
  Administered 2020-06-04 – 2020-06-06 (×5): 100 mg via ORAL
  Filled 2020-06-04 (×5): qty 1

## 2020-06-04 MED ORDER — DIPHENHYDRAMINE HCL 12.5 MG/5ML PO ELIX: 12.5000 mg | ORAL_SOLUTION | Freq: Four times a day (QID) | ORAL | Status: DC | PRN

## 2020-06-04 MED ORDER — FENTANYL CITRATE (PF) 100 MCG/2ML IJ SOLN
INTRAMUSCULAR | Status: AC
  Filled 2020-06-04: qty 2

## 2020-06-04 MED ORDER — LIDOCAINE 2% (20 MG/ML) 5 ML SYRINGE
INTRAMUSCULAR | Status: DC | PRN
  Administered 2020-06-04: 100 mg via INTRAVENOUS

## 2020-06-04 MED ORDER — FENTANYL CITRATE (PF) 100 MCG/2ML IJ SOLN
25.0000 ug | INTRAMUSCULAR | Status: DC | PRN
  Administered 2020-06-04 (×2): 25 ug via INTRAVENOUS
  Administered 2020-06-04: 50 ug via INTRAVENOUS

## 2020-06-04 MED ORDER — ROCURONIUM BROMIDE 10 MG/ML (PF) SYRINGE
PREFILLED_SYRINGE | INTRAVENOUS | Status: DC | PRN
  Administered 2020-06-04 (×2): 20 mg via INTRAVENOUS
  Administered 2020-06-04: 60 mg via INTRAVENOUS
  Administered 2020-06-04 (×2): 20 mg via INTRAVENOUS

## 2020-06-04 MED ORDER — NITROGLYCERIN 0.4 MG SL SUBL: 0.4000 mg | SUBLINGUAL_TABLET | SUBLINGUAL | Status: DC | PRN

## 2020-06-04 MED ORDER — SPIRONOLACTONE 25 MG PO TABS
25.0000 mg | ORAL_TABLET | Freq: Every day | ORAL | Status: DC
  Administered 2020-06-04 – 2020-06-06 (×3): 25 mg via ORAL
  Filled 2020-06-04 (×3): qty 1

## 2020-06-04 MED ORDER — SUGAMMADEX SODIUM 500 MG/5ML IV SOLN
INTRAVENOUS | Status: AC
  Filled 2020-06-04: qty 5

## 2020-06-04 MED ORDER — MORPHINE SULFATE (PF) 2 MG/ML IV SOLN
2.0000 mg | INTRAVENOUS | Status: DC | PRN
  Administered 2020-06-04 – 2020-06-05 (×4): 2 mg via INTRAVENOUS
  Filled 2020-06-04 (×4): qty 1

## 2020-06-04 MED ORDER — FENTANYL CITRATE (PF) 250 MCG/5ML IJ SOLN
INTRAMUSCULAR | Status: DC | PRN
  Administered 2020-06-04 (×2): 50 ug via INTRAVENOUS
  Administered 2020-06-04: 100 ug via INTRAVENOUS
  Administered 2020-06-04: 50 ug via INTRAVENOUS

## 2020-06-04 MED ORDER — TRAMADOL HCL 50 MG PO TABS: 50.0000 mg | ORAL_TABLET | Freq: Four times a day (QID) | ORAL | 0 refills | Status: DC | PRN

## 2020-06-04 MED ORDER — MIDAZOLAM HCL 2 MG/2ML IJ SOLN
INTRAMUSCULAR | Status: AC
  Filled 2020-06-04: qty 2

## 2020-06-04 MED ORDER — DIPHENHYDRAMINE HCL 50 MG/ML IJ SOLN: 12.5000 mg | Freq: Four times a day (QID) | INTRAMUSCULAR | Status: DC | PRN

## 2020-06-04 MED ORDER — CEFAZOLIN IN SODIUM CHLORIDE 3-0.9 GM/100ML-% IV SOLN
INTRAVENOUS | Status: AC
  Filled 2020-06-04: qty 100

## 2020-06-04 MED ORDER — CEFAZOLIN IN SODIUM CHLORIDE 3-0.9 GM/100ML-% IV SOLN
3.0000 g | Freq: Once | INTRAVENOUS | Status: AC
  Administered 2020-06-04 (×2): 3 g via INTRAVENOUS
  Filled 2020-06-04: qty 100

## 2020-06-04 MED ORDER — ONDANSETRON HCL 4 MG/2ML IJ SOLN: 4.0000 mg | INTRAMUSCULAR | Status: DC | PRN

## 2020-06-04 SURGICAL SUPPLY — 63 items
ADH SKN CLS APL DERMABOND .7 (GAUZE/BANDAGES/DRESSINGS) ×2
AGENT HMST KT MTR STRL THRMB (HEMOSTASIS) ×1
APL ESCP 34 STRL LF DISP (HEMOSTASIS)
APL PRP STRL LF DISP 70% ISPRP (MISCELLANEOUS) ×1
APPLICATOR SURGIFLO ENDO (HEMOSTASIS) IMPLANT
BAG SPEC RTRVL LRG 6X4 10 (ENDOMECHANICALS) ×1
CHLORAPREP W/TINT 26 (MISCELLANEOUS) ×2 IMPLANT
CLIP VESOLOCK LG 6/CT PURPLE (CLIP) ×2 IMPLANT
CLIP VESOLOCK MED LG 6/CT (CLIP) ×4 IMPLANT
COVER SURGICAL LIGHT HANDLE (MISCELLANEOUS) ×2 IMPLANT
COVER TIP SHEARS 8 DVNC (MISCELLANEOUS) ×1 IMPLANT
COVER TIP SHEARS 8MM DA VINCI (MISCELLANEOUS) ×2
COVER WAND RF STERILE (DRAPES) IMPLANT
CUTTER ECHEON FLEX ENDO 45 340 (ENDOMECHANICALS) ×1 IMPLANT
DECANTER SPIKE VIAL GLASS SM (MISCELLANEOUS) ×2 IMPLANT
DERMABOND ADVANCED (GAUZE/BANDAGES/DRESSINGS) ×2
DERMABOND ADVANCED .7 DNX12 (GAUZE/BANDAGES/DRESSINGS) ×2 IMPLANT
DRAIN CHANNEL 15F RND FF 3/16 (WOUND CARE) ×2 IMPLANT
DRAPE ARM DVNC X/XI (DISPOSABLE) ×4 IMPLANT
DRAPE COLUMN DVNC XI (DISPOSABLE) ×1 IMPLANT
DRAPE DA VINCI XI ARM (DISPOSABLE) ×8
DRAPE DA VINCI XI COLUMN (DISPOSABLE) ×2
DRAPE INCISE IOBAN 66X45 STRL (DRAPES) ×2 IMPLANT
DRAPE SHEET LG 3/4 BI-LAMINATE (DRAPES) ×2 IMPLANT
DRSG TEGADERM 4X4.75 (GAUZE/BANDAGES/DRESSINGS) ×1 IMPLANT
ELECT PENCIL ROCKER SW 15FT (MISCELLANEOUS) ×2 IMPLANT
ELECT REM PT RETURN 15FT ADLT (MISCELLANEOUS) ×2 IMPLANT
EVACUATOR SILICONE 100CC (DRAIN) ×2 IMPLANT
GLOVE SURG ENC MOIS LTX SZ6.5 (GLOVE) ×2 IMPLANT
GLOVE SURG ENC TEXT LTX SZ7.5 (GLOVE) ×4 IMPLANT
GOWN STRL REUS W/TWL LRG LVL3 (GOWN DISPOSABLE) ×6 IMPLANT
HEMOSTAT SURGICEL 4X8 (HEMOSTASIS) IMPLANT
HOLDER FOLEY CATH W/STRAP (MISCELLANEOUS) ×2 IMPLANT
IRRIG SUCT STRYKERFLOW 2 WTIP (MISCELLANEOUS) ×2
IRRIGATION SUCT STRKRFLW 2 WTP (MISCELLANEOUS) ×1 IMPLANT
KIT BASIN OR (CUSTOM PROCEDURE TRAY) ×2 IMPLANT
KIT TURNOVER KIT A (KITS) ×2 IMPLANT
POUCH SPECIMEN RETRIEVAL 10MM (ENDOMECHANICALS) ×2 IMPLANT
PROTECTOR NERVE ULNAR (MISCELLANEOUS) ×4 IMPLANT
RELOAD STAPLE 45 4.1 GRN THCK (STAPLE) IMPLANT
SEAL CANN UNIV 5-8 DVNC XI (MISCELLANEOUS) ×4 IMPLANT
SEAL XI 5MM-8MM UNIVERSAL (MISCELLANEOUS) ×8
SET TUBE SMOKE EVAC HIGH FLOW (TUBING) ×2 IMPLANT
SOLUTION ELECTROLUBE (MISCELLANEOUS) ×2 IMPLANT
SPONGE GAUZE 2X2 8PLY STRL LF (GAUZE/BANDAGES/DRESSINGS) ×1 IMPLANT
STAPLE RELOAD 45 GRN (STAPLE) ×1 IMPLANT
STAPLE RELOAD 45MM GREEN (STAPLE) ×2
SURGIFLO W/THROMBIN 8M KIT (HEMOSTASIS) ×2 IMPLANT
SUT ETHILON 3 0 PS 1 (SUTURE) ×2 IMPLANT
SUT MNCRL AB 4-0 PS2 18 (SUTURE) ×4 IMPLANT
SUT V-LOC BARB 180 2/0GR6 GS22 (SUTURE) ×2
SUT VIC AB 0 CT1 27 (SUTURE) ×2
SUT VIC AB 0 CT1 27XBRD ANTBC (SUTURE) ×1 IMPLANT
SUT VICRYL 0 UR6 27IN ABS (SUTURE) ×2 IMPLANT
SUT VLOC BARB 180 ABS3/0GR12 (SUTURE) ×4
SUTURE V-LC BRB 180 2/0GR6GS22 (SUTURE) ×1 IMPLANT
SUTURE VLOC BRB 180 ABS3/0GR12 (SUTURE) ×1 IMPLANT
TOWEL OR 17X26 10 PK STRL BLUE (TOWEL DISPOSABLE) ×2 IMPLANT
TOWEL OR NON WOVEN STRL DISP B (DISPOSABLE) ×2 IMPLANT
TRAY FOLEY MTR SLVR 16FR STAT (SET/KITS/TRAYS/PACK) ×2 IMPLANT
TRAY LAPAROSCOPIC (CUSTOM PROCEDURE TRAY) ×2 IMPLANT
TROCAR XCEL 12X100 BLDLESS (ENDOMECHANICALS) ×2 IMPLANT
WATER STERILE IRR 1000ML POUR (IV SOLUTION) ×2 IMPLANT

## 2020-06-04 NOTE — Op Note (Signed)
Preoperative diagnosis: Right renal neoplasm  Postoperative diagnosis: Right renal neoplasm  Procedure:  1. Right robotic-assisted laparoscopic partial nephrectomy 2. Intraoperative renal ultrasonography  Surgeon: Pryor Curia. M.D.  Assistant(s): Debbrah Alar, PA-C  An assistant was required for this surgical procedure.  The duties of the assistant included but were not limited to suctioning, passing suture, camera manipulation, retraction. This procedure would not be able to be performed without an Environmental consultant.  Resident: Dr. Ermelinda Das  Anesthesia: General  Complications: None  EBL: 150 mL  IVF:  1600 mL crystalloid  Specimens: 1. Right renal neoplasm  Disposition of specimens: Pathology  Intraoperative findings:       1. Warm renal ischemia time: 19 minutes       2. Intraoperative renal ultrasound findings: There was a large solid tumor measuring approximately 5 cm off the posterior upper right renal pole consistent with renal malignancy.  Drains: 1. # 15 Blake perinephric drain  Indication:  Jonathan Barrett is a 55 y.o. year old patient with a right renal mass.  After a thorough review of the management options for their renal mass, they elected to proceed with surgical treatment and the above procedure.  We have discussed the potential benefits and risks of the procedure, side effects of the proposed treatment, the likelihood of the patient achieving the goals of the procedure, and any potential problems that might occur during the procedure or recuperation. Informed consent has been obtained.   Description of procedure:  The patient was taken to the operating room and a general anesthetic was administered. The patient was given preoperative antibiotics, placed in the right modified flank position with care to pad all potential pressure points, and prepped and draped in the usual sterile fashion. Next a preoperative timeout was performed.  A site was  selected on in the midline for placement of the assistant port. This was placed using a standard open Hassan technique which allowed entry into the peritoneal cavity under direct vision and without difficulty. A 12 mm port was placed and a pneumoperitoneum established. The camera was then used to inspect the abdomen and there was no evidence of any intra-abdominal injuries or other abnormalities. The remaining abdominal ports were then placed. 8 mm robotic ports were placed in the right upper quadrant, right lower quadrant, and far right lateral abdominal wall. An 8 mm port was placed for the camera site just right of the umbilicus. All ports were placed under direct vision without difficulty. The surgical cart was then docked.   Utilizing the cautery scissors, the white line of Toldt was incised allowing the colon to be mobilized medially and the plane between the mesocolon and the anterior layer of Gerota's fascia to be developed and the kidney to be exposed.  The ureter and gonadal vein were identified inferiorly and the ureter was lifted anteriorly off the psoas muscle.  Dissection proceeded superiorly along the gonadal vein until the renal vein was identified.  The renal hilum was then carefully isolated with a combination of blunt and sharp dissection allowing the renal arterial and venous structures to be separated and isolated in preparation for renal hilar vessel clamping. There was a trifurcating artery along with another branching artery and all together a total of 5 arterial vessels were isolated  There was an anterior and posterior renal vein.  Attention turned to the kidney and the perinephric fat surrounding the renal mass was removed and the kidney was mobilized sufficiently for exposure and resection of the  renal mass.   Intraoperative renal ultrasonography was utilized with the laparoscopic ultrasound probe to identify the renal tumor and identify the tumor margins.  This suggested that the  tumor extended on the posterior side of the kidney near the main renal hilum.  It became apparent that the two upper pole renal arteries would need to be sacrificed for tumor resection.  After these arterial structures were ligated with Weck clips, there were divided.  There was still a posterior vein though that would need to be ligated to allow tumor removal.  It was decided that an upper pole heminephrectomy would be required.  The margins were marked with cautery using ultrasound to mark the margins.  Once the renal mass was properly isolated, preparations were made for resection of the tumor.  Reconstructive sutures were placed into the abdomen for the renorrhaphy portion of the procedure.  The remaining 3 arterial structures were then clamped with bulldog clamps.  The tumor was then excised with cold scissor dissection along with an adequate visible gross margin of normal renal parenchyma. The tumor appeared to be excised without any gross violation of the tumor. The renal collecting system was entered during removal of the tumor.  A running 3-0 V-lock suture was then brought through the capsule of the kidney and run along the base of the renal defect to provide hemostasis and close any entry into the renal collecting system if present. A second running 3-0 V lock was used for further hemostasis and to provide compression of the renal parenchyma.  Weck clips were used to secure this suture outside the renal capsule at the proximal and distal ends. An additional hemostatic agent (Surgiflo) was then placed into the renal defect. A running 2-0 V lock suture was then used to close the capsule of the kidney using a sliding clip technique which resulted in excellent hemostasis.    The bulldog clamps were then removed from the renal hilar vessel(s).. Total warm renal ischemia time was 19 minutes. The renal tumor resection site was examined. Hemostasis appeared adequate.   The kidney was placed back into its  normal anatomic position and covered with perinephric fat as needed.  A # 65 Blake drain was then brought through the lateral lower port site and positioned in the perinephric space.  It was secured to the skin with a nylon suture. The surgical cart was undocked.  The renal tumor specimen was removed intact within an endopouch retrieval bag via the upper midline port site. This incision site was closed at the fascial layer with 0-vicryl suture. All other laparoscopic/robotic ports were removed under direct vision and the pneumoperitoneum let down with inspection of the operative field performed and hemostasis again confirmed. All incision sites were then injected with local anesthetic and reapproximated at the skin level with 4-0 monocryl subcuticular closures.  Dermabond was applied to the skin.  The patient tolerated the procedure well and without complications.  The patient was able to be extubated and transferred to the recovery unit in satisfactory condition.  Pryor Curia MD

## 2020-06-04 NOTE — Anesthesia Procedure Notes (Signed)
Procedure Name: Intubation Date/Time: 06/04/2020 7:28 AM Performed by: Talbot Grumbling, CRNA Pre-anesthesia Checklist: Patient identified, Emergency Drugs available, Suction available and Patient being monitored Patient Re-evaluated:Patient Re-evaluated prior to induction Oxygen Delivery Method: Circle system utilized Preoxygenation: Pre-oxygenation with 100% oxygen Induction Type: IV induction Ventilation: Mask ventilation without difficulty Laryngoscope Size: Mac and 4 Grade View: Grade I Tube type: Oral Tube size: 8.0 mm Number of attempts: 1 Airway Equipment and Method: Stylet Placement Confirmation: ETT inserted through vocal cords under direct vision,  positive ETCO2 and breath sounds checked- equal and bilateral Secured at: 23 cm Tube secured with: Tape Dental Injury: Teeth and Oropharynx as per pre-operative assessment

## 2020-06-04 NOTE — Discharge Instructions (Signed)
1.  Activity:  You are encouraged to ambulate frequently (about every hour during waking hours) to help prevent blood clots from forming in your legs or lungs.  However, you should not engage in any heavy lifting (> 10-15 lbs), strenuous activity, or straining. 2. Diet: You should advance your diet as instructed by your physician.  It will be normal to have some bloating, nausea, and abdominal discomfort intermittently. 3. Prescriptions:  You will be provided a prescription for pain medication to take as needed.  If your pain is not severe enough to require the prescription pain medication, you may take extra strength Tylenol instead which will have less side effects.  You should also take a prescribed stool softener to avoid straining with bowel movements as the prescription pain medication may constipate you. 4. Incisions: You may remove your dressing bandages 48 hours after surgery if not removed in the hospital.  You will either have some small staples or special tissue glue at each of the incision sites. Once the bandages are removed (if present), the incisions may stay open to air.  You may start showering (but not soaking or bathing in water) the 2nd day after surgery and the incisions simply need to be patted dry after the shower.  No additional care is needed. 5. What to call us about: You should call the office [phone]) if you develop fever > 101 or develop persistent vomiting.   You may resume advil, aleve, vitamins, and supplements 7 days after surgery. Resume Brillinta per Dr. Lynne Logan instructions.

## 2020-06-04 NOTE — Anesthesia Postprocedure Evaluation (Signed)
Anesthesia Post Note  Patient: Jonathan Barrett  Procedure(s) Performed: XI ROBOTIC ASSITED PARTIAL NEPHRECTOMY (Right )     Patient location during evaluation: PACU Anesthesia Type: General Level of consciousness: awake Pain management: pain level controlled Vital Signs Assessment: post-procedure vital signs reviewed and stable Respiratory status: spontaneous breathing Cardiovascular status: stable Postop Assessment: no apparent nausea or vomiting Anesthetic complications: no   No complications documented.  Last Vitals:  Vitals:   06/04/20 1245 06/04/20 1300  BP: (!) 150/95 131/78  Pulse: 76 76  Resp: 15 11  Temp:  37.1 C  SpO2: 95% 98%    Last Pain:  Vitals:   06/04/20 1300  TempSrc:   PainSc: 5                  Taleeyah Bora

## 2020-06-04 NOTE — Interval H&P Note (Signed)
History and Physical Interval Note:  06/04/2020 6:36 AM  Jonathan Barrett  has presented today for surgery, with the diagnosis of RIGHT RENAL NEOPLASM.  The various methods of treatment have been discussed with the patient and family. After consideration of risks, benefits and other options for treatment, the patient has consented to  Procedure(s): XI ROBOTIC ASSITED PARTIAL NEPHRECTOMY (Right) as a surgical intervention.  The patient's history has been reviewed, patient examined, no change in status, stable for surgery.  I have reviewed the patient's chart and labs.  Questions were answered to the patient's satisfaction.     [employer]

## 2020-06-04 NOTE — Transfer of Care (Signed)
Immediate Anesthesia Transfer of Care Note  Patient: Jonathan Barrett  Procedure(s) Performed: XI ROBOTIC ASSITED PARTIAL NEPHRECTOMY (Right )  Patient Location: PACU  Anesthesia Type:General  Level of Consciousness: drowsy and patient cooperative  Airway & Oxygen Therapy: Patient Spontanous Breathing and Patient connected to face mask oxygen  Post-op Assessment: Report given to RN and Post -op Vital signs reviewed and stable  Post vital signs: Reviewed and stable  Last Vitals:  Vitals Value Taken Time  BP 149/95 06/04/20 1143  Temp    Pulse 77 06/04/20 1145  Resp 20 06/04/20 1145  SpO2 100 % 06/04/20 1145  Vitals shown include unvalidated device data.  Last Pain:  Vitals:   06/04/20 0601  TempSrc:   PainSc: 0-No pain         Complications: No complications documented.

## 2020-06-04 NOTE — Progress Notes (Signed)
Patient ID: Jonathan Barrett, male   DOB: 1965-09-25, 55 y.o.   MRN: [id number]  Post-op note  Subjective: The patient is doing well.  No complaints.  Objective: Vital signs in last 24 hours: Temp:  [98.2 F (36.8 C)-99.3 F (37.4 C)] 98.2 F (36.8 C) (05/02 1422) Pulse Rate:  [75-84] 84 (05/02 1422) Resp:  [11-18] 18 (05/02 1422) BP: (129-157)/(78-98) 146/92 (05/02 1422) SpO2:  [95 %-100 %] 99 % (05/02 1422) Weight:  [482 kg] 137 kg (05/02 0529)  Intake/Output from previous day: No intake/output data recorded. Intake/Output this shift: Total I/O In: 1600 [I.V.:1400; IV Piggyback:200] Out: 440 [Urine:230; Drains:60; Blood:150]  Physical Exam:  General: Alert and oriented. Abdomen: Soft, Nondistended. Incisions: Clean and dry.  Lab Results: Recent Labs    06/04/20 1208  HGB 14.5  HCT 43.7    Assessment/Plan: POD#0   1) Continue to monitor, bedrest tonight   Pryor Curia. MD   LOS: 0 days   [name] 06/04/2020, 4:13 PM

## 2020-06-05 ENCOUNTER — Encounter (HOSPITAL_COMMUNITY): Payer: Self-pay | Admitting: Urology

## 2020-06-05 DIAGNOSIS — D49511 Neoplasm of unspecified behavior of right kidney: Secondary | ICD-10-CM | POA: Diagnosis not present

## 2020-06-05 LAB — CREATININE, FLUID (PLEURAL, PERITONEAL, JP DRAINAGE): Creat, Fluid: 1.1 mg/dL

## 2020-06-05 LAB — BASIC METABOLIC PANEL
Anion gap: 9 (ref 5–15)
BUN: 14 mg/dL (ref 6–20)
CO2: 23 mmol/L (ref 22–32)
Calcium: 8.4 mg/dL — ABNORMAL LOW (ref 8.9–10.3)
Chloride: 101 mmol/L (ref 98–111)
Creatinine, Ser: 1.26 mg/dL — ABNORMAL HIGH (ref 0.61–1.24)
GFR, Estimated: >60 mL/min (ref >60)
Glucose, Bld: 126 mg/dL — ABNORMAL HIGH (ref 70–99)
Potassium: 3.9 mmol/L (ref 3.5–5.1)
Sodium: 133 mmol/L — ABNORMAL LOW (ref 135–145)

## 2020-06-05 LAB — HEMOGLOBIN AND HEMATOCRIT, BLOOD
HCT: 37.2 % — ABNORMAL LOW (ref 39.0–52.0)
Hemoglobin: 12.6 g/dL — ABNORMAL LOW (ref 13.0–17.0)

## 2020-06-05 MED ORDER — TRAMADOL HCL 50 MG PO TABS
50.0000 mg | ORAL_TABLET | Freq: Four times a day (QID) | ORAL | Status: DC | PRN
Start: 2020-06-05 — End: 2020-06-06
  Administered 2020-06-05 (×2): 100 mg via ORAL
  Filled 2020-06-05 (×2): qty 2

## 2020-06-05 MED ORDER — BISACODYL 10 MG RE SUPP
10.0000 mg | Freq: Once | RECTAL | Status: AC
  Administered 2020-06-05: 10 mg via RECTAL
  Filled 2020-06-05: qty 1

## 2020-06-05 NOTE — Plan of Care (Signed)

## 2020-06-05 NOTE — Progress Notes (Signed)
Urology Progress Note   1 Day Post-Op  Subjective: NAEON.  Some leakage around drain site Pain relatively well controlled Tolerating diet Cr increased from baseline Hgb relatively stable  Objective: Vital signs in last 24 hours: Temp:  [98.2 F (36.8 C)-99.7 F (37.6 C)] 98.6 F (37 C) (05/03 0444) Pulse Rate:  [75-96] 78 (05/03 0444) Resp:  [11-18] 16 (05/03 0444) BP: (107-157)/(65-98) 127/78 (05/03 0444) SpO2:  [95 %-100 %] 96 % (05/03 0444)  Intake/Output from previous day: 05/02 0701 - 05/03 0700 In: 3345.2 [I.V.:3145.2; IV Piggyback:200] Out: 3700 [Urine:3380; Drains:170; Blood:150] Intake/Output this shift: No intake/output data recorded.  Physical Exam:  General: Alert and oriented CV: RRR Lungs: Clear Abdomen: Soft, appropriately tender. Incisions c/d/i. JP SS GU: Foley in place draining clear yellow urine  Ext: NT, No erythema  Lab Results: Recent Labs    06/04/20 1208 06/05/20 0436  HGB 14.5 12.6*  HCT 43.7 37.2*   BMET Recent Labs    06/04/20 1208 06/05/20 0436  NA 135 133*  K 4.5 3.9  CL 103 101  CO2 23 23  GLUCOSE 173* 126*  BUN 16 14  CREATININE 1.41* 1.26*  CALCIUM 8.6* 8.4*     Studies/Results: No results found.  Assessment/Plan:  55 y.o. male s/p R partial nephrectomy  - cont pain control as needed - cont JP to drainage - regular diet - medlock - encourage ambulation - remove foley with TOV today - repeat labs tmrw to trend Cr, Hgb    LOS: 0 days   Sherrita Riederer 06/05/2020, 7:31 AM

## 2020-06-06 DIAGNOSIS — D49511 Neoplasm of unspecified behavior of right kidney: Secondary | ICD-10-CM | POA: Diagnosis not present

## 2020-06-06 LAB — BASIC METABOLIC PANEL
Anion gap: 8 (ref 5–15)
BUN: 16 mg/dL (ref 6–20)
CO2: 25 mmol/L (ref 22–32)
Calcium: 8.9 mg/dL (ref 8.9–10.3)
Chloride: 102 mmol/L (ref 98–111)
Creatinine, Ser: 1.2 mg/dL (ref 0.61–1.24)
GFR, Estimated: >60 mL/min (ref >60)
Glucose, Bld: 121 mg/dL — ABNORMAL HIGH (ref 70–99)
Potassium: 3.7 mmol/L (ref 3.5–5.1)
Sodium: 135 mmol/L (ref 135–145)

## 2020-06-06 LAB — HEMOGLOBIN AND HEMATOCRIT, BLOOD
HCT: 38.4 % — ABNORMAL LOW (ref 39.0–52.0)
Hemoglobin: 12.7 g/dL — ABNORMAL LOW (ref 13.0–17.0)

## 2020-06-06 NOTE — Progress Notes (Signed)
D/C per w/c w all belongings in stable condition.

## 2020-06-06 NOTE — Plan of Care (Signed)
Reviewed written d/c instructions w pt and his wife and all questions answered. Removed JP drain intact, oozing sm amt of serosangunieous fluid. Demonstrated how to change dsg over old JP site w wife and she verbalized understanding. Extra 4 X 4 guaze and tape given to pt/wife. Abd remains very distended, mid abd port is stretched taut and appears pink, but no overt drainage. Instructed to call Md's office if this spot opens up or starts draining purulent material. They verb understanding.

## 2020-06-06 NOTE — Progress Notes (Signed)
Noted a small amount of serosanguineous   leakage at JP site.  Replaced dressing with sterile 4 X 4 drain gauze and hypo fix  Tape. Emptied and recharged JP Bulb.   Patient noted with 1 small bowel movement. See flow sheet

## 2020-06-06 NOTE — Discharge Summary (Signed)
.   Date of admission: 06/04/2020  Date of discharge: 06/06/2020  Admission diagnosis: Right renal neoplasm  Discharge diagnosis: Right renal neoplasm  Secondary diagnoses: CAD, hypertension  History and Physical: For full details, please see admission history and physical. Briefly, Jonathan Barrett is a 55 y.o. year old patient with an incidentally detected 4.9 cm right renal neoplasm concerning for malignancy.   Hospital Course: He underwent a right RAL partial nephrectomy on 06/04/20.  This required an extensive resection with the upper 40% of the kidney.  He remained hemodynamically stable postoperatively.  He began ambulating on POD#1 and his diet was advanced.  His pain was controlled with oral medication.  His bowel function returned.  His renal function remained relatively stable as did his Hgb despite continued ASA 81 mg.  His drain Cr was consistent with serum and was removed the morning of POD #2 and he was discharged home in good condition.    Laboratory values: Recent Labs    06/04/20 1208 06/05/20 0436 06/06/20 0417  HGB 14.5 12.6* 12.7*  HCT 43.7 37.2* 38.4*   Recent Labs    06/05/20 0436 06/06/20 0417  CREATININE 1.26* 1.20    Disposition: Home  Discharge instruction: The patient was instructed to be ambulatory but told to refrain from heavy lifting, strenuous activity, or driving.   Discharge medications:  Allergies as of 06/06/2020   No Known Allergies     Medication List    STOP taking these medications   ticagrelor 90 MG Tabs tablet Commonly known as: BRILINTA     TAKE these medications   amLODipine 10 MG tablet Commonly known as: NORVASC Take 1 tablet (10 mg total) by mouth daily.   aspirin EC 81 MG tablet Take 1 tablet (81 mg total) by mouth daily. Swallow whole.   atorvastatin 80 MG tablet Commonly known as: Lipitor Take 1 tablet (80 mg total) by mouth daily.   carvedilol 12.5 MG tablet Commonly known as: COREG Take 1 tablet (12.5 mg total) by  mouth 2 (two) times daily.   docusate sodium 100 MG capsule Commonly known as: COLACE Take 1 capsule (100 mg total) by mouth 2 (two) times daily.   hydrochlorothiazide 25 MG tablet Commonly known as: HYDRODIURIL Take 1 tablet (25 mg total) by mouth daily.   nitroGLYCERIN 0.4 MG SL tablet Commonly known as: NITROSTAT Place 0.4 mg under the tongue every 5 (five) minutes as needed for chest pain.   ramipril 10 MG capsule Commonly known as: ALTACE Take 1 capsule (10 mg total) by mouth 2 (two) times daily.   spironolactone 25 MG tablet Commonly known as: ALDACTONE Take 1 tablet (25 mg total) by mouth daily.   traMADol 50 MG tablet Commonly known as: Ultram Take 1-2 tablets (50-100 mg total) by mouth every 6 (six) hours as needed for moderate pain or severe pain.       Followup:   Follow-up Information    Raynelle Bring, MD On 06/27/2020.   Specialty: Urology Why: at 12:45 Contact information: [place]  [phone]

## 2020-06-06 NOTE — Progress Notes (Signed)
Patient ID: Jonathan Barrett, male   DOB: [birth year], 55 y.o.   MRN: [id number]  2 Days Post-Op Subjective: Doing well overall.  Pain controlled on oral medication.  Ambulating well.  Tolerating regular diet. Had a BM last night.  Objective: Vital signs in last 24 hours: Temp:  [97.8 F (36.6 C)-98.6 F (37 C)] 98.3 F (36.8 C) (05/04 0427) Pulse Rate:  [81-91] 81 (05/04 0427) Resp:  [18-20] 18 (05/04 0427) BP: (105-139)/(70-88) 128/88 (05/04 0427) SpO2:  [94 %-98 %] 94 % (05/04 0427)  Intake/Output from previous day: 05/03 0701 - 05/04 0700 In: 1960.1 [P.O.:1080; I.V.:880.1] Out: 1670 [Urine:1600; Drains:70] Intake/Output this shift: No intake/output data recorded.  Physical Exam:  General: Alert and oriented CV: RRR Lungs: Clear Abdomen: Soft, ND, positive BS Incisions: C/D/I, drain in place with lighter serous drainage Ext: NT, No erythema  Lab Results: Recent Labs    06/04/20 1208 06/05/20 0436 06/06/20 0417  HGB 14.5 12.6* 12.7*  HCT 43.7 37.2* 38.4*   BMET Recent Labs    06/05/20 0436 06/06/20 0417  NA 133* 135  K 3.9 3.7  CL 101 102  CO2 23 25  GLUCOSE 126* 121*  BUN 14 16  CREATININE 1.26* 1.20  CALCIUM 8.4* 8.9     Studies/Results: Path pending.  Drain Cr 1.1  Assessment/Plan: POD # 2 s/p right RAL partial nephrectomy - D/C drain - D/C home   LOS: 0 days   [name] 06/06/2020, 7:21 AM

## 2020-06-07 LAB — SURGICAL PATHOLOGY

## 2020-08-24 ENCOUNTER — Other Ambulatory Visit: Payer: Self-pay

## 2020-08-24 ENCOUNTER — Telehealth: Payer: Self-pay | Admitting: Family Medicine

## 2020-08-24 DIAGNOSIS — I10 Essential (primary) hypertension: Secondary | ICD-10-CM

## 2020-08-24 DIAGNOSIS — E782 Mixed hyperlipidemia: Secondary | ICD-10-CM

## 2020-08-24 NOTE — Telephone Encounter (Signed)
Future CBC, CMP and Lipid ordered today. Pt was made aware by Mitzi during original call that orders would be placed.

## 2020-09-17 NOTE — Progress Notes (Signed)
Cardiology Office Note:    Date:  09/18/2020   ID:  Jonathan Barrett, DOB August 24, 1965, MRN [id number]  PCP:  Dettinger, Fransisca Kaufmann, MD  Cardiologist:  Donato Heinz, MD  Electrophysiologist:  None   Referring MD: Dettinger, Fransisca Kaufmann, MD   Chief Complaint  Patient presents with   Follow-up   Coronary Artery Disease     History of Present Illness:    Jonathan Barrett is a 55 y.o. male with a hx of CAD status post LCx PCI 02/13/2020, renal cell carcinoma, hypertension, hyperlipidemia who presents for follow-up.  He was initially seen on 02/01/2020.  Reported symptoms consistent with typical angina.  Cardiac catheterization on 02/13/2020 showed severe two-vessel obstructive CAD (85% proximal LCx stenosis, CTO of mid RCA with left-to-right collaterals, 65% ramus, 35% proximal to mid LAD, aneurysmal dilatation of distal left main), normal LV function, mild elevation in LVEDP 20 mmHg.  Treated with DES to proximal LCx.  Echocardiogram on 03/17/2019 showed normal biventricular function, grade 1 diastolic dysfunction, mild dilatation of aortic root measuring 42 mm.  CTA chest on 03/26/2020 showed mild aortic root dilatation measuring 44 mm, 40 mm in ascending aorta.  Also noted to have 4.1 x 3.5 cm mass in the right kidney consistent with renal cell carcinoma.  He was referred to urology and underwent partial nephrectomy on 06/04/2020 with Dr. Alinda Money.  Since last clinic visit, he reports that he has been doing well.  He has been taking DAPT, denies any bleeding issues.  Denies any chest pain.  Reports occasional dyspnea.  Reports he is walking 5 to 6 miles per day.  Denies any exertional symptoms.  Denies any lightheadedness, syncope, lower extremity edema, or palpitations.  Reports BP has been 120s over 60s when checks at home.   Wt Readings from Last 3 Encounters:  09/18/20 300 lb (136.1 kg)  06/04/20 (!) 302 lb 0.5 oz (137 kg)  05/31/20 (!) 302 lb (137 kg)     Past Medical History:  Diagnosis  Date   Coronary artery disease    DES   Hyperlipidemia    Hypertension     Past Surgical History:  Procedure Laterality Date   CORONARY STENT INTERVENTION N/A 02/13/2020   Procedure: CORONARY STENT INTERVENTION;  Surgeon: Martinique, Peter M, MD;  Location: [facility];  Service: Cardiovascular;  Laterality: N/A;   CYST EXCISION     LEFT HEART CATH AND CORONARY ANGIOGRAPHY N/A 02/13/2020   Procedure: LEFT HEART CATH AND CORONARY ANGIOGRAPHY;  Surgeon: Martinique, Peter M, MD;  Location: [facility];  Service: Cardiovascular;  Laterality: N/A;   MIDDLE EAR SURGERY     ROBOTIC ASSITED PARTIAL NEPHRECTOMY Right 06/04/2020   Procedure: XI ROBOTIC ASSITED PARTIAL NEPHRECTOMY;  Surgeon: Raynelle Bring, MD;  Location: [facility];  Service: Urology;  Laterality: Right;    Current Medications: Current Meds  Medication Sig   amLODipine (NORVASC) 10 MG tablet Take 1 tablet (10 mg total) by mouth daily.   aspirin EC 81 MG tablet Take 1 tablet (81 mg total) by mouth daily. Swallow whole.   atorvastatin (LIPITOR) 80 MG tablet Take 1 tablet (80 mg total) by mouth daily.   carvedilol (COREG) 12.5 MG tablet Take 1 tablet (12.5 mg total) by mouth 2 (two) times daily.   docusate sodium (COLACE) 100 MG capsule Take 1 capsule (100 mg total) by mouth 2 (two) times daily.   hydrochlorothiazide (HYDRODIURIL) 25 MG tablet Take 1 tablet (25 mg total) by mouth daily.  nitroGLYCERIN (NITROSTAT) 0.4 MG SL tablet Place 0.4 mg under the tongue every 5 (five) minutes as needed for chest pain.   ramipril (ALTACE) 10 MG capsule Take 1 capsule (10 mg total) by mouth 2 (two) times daily.   spironolactone (ALDACTONE) 25 MG tablet Take 1 tablet (25 mg total) by mouth daily.   Ticagrelor (BRILINTA PO) Take 1 tablet by mouth in the morning and at bedtime.   traMADol (ULTRAM) 50 MG tablet Take 1-2 tablets (50-100 mg total) by mouth every 6 (six) hours as needed for moderate pain or severe pain.     Allergies:   Patient has  no known allergies.   Social History   Socioeconomic History   Marital status: Married    Spouse name: Not on file   Number of children: Not on file   Years of education: Not on file   Highest education level: Not on file  Occupational History   Not on file  Tobacco Use   Smoking status: Never   Smokeless tobacco: Never  Vaping Use   Vaping Use: Never used  Substance and Sexual Activity   Alcohol use: Yes    Alcohol/week: 12.0 standard drinks    Types: 12 Cans of beer per week    Comment: occas.   Drug use: No   Sexual activity: Yes  Other Topics Concern   Not on file  Social History Narrative   Not on file   Social Determinants of Health   Financial Resource Strain: Not on file  Food Insecurity: Not on file  Transportation Needs: Not on file  Physical Activity: Not on file  Stress: Not on file  Social Connections: Not on file     Family History: The patient's family history includes Heart disease in his father.  ROS:   Please see the history of present illness.     All other systems reviewed and are negative.  EKGs/Labs/Other Studies Reviewed:    The following studies were reviewed today:   EKG:  EKG is ordered today.  The ekg ordered today demonstrates normal sinus rhythm, rate 76, no ST/T abnormalities  Recent Labs: 02/01/2020: ALT 31; TSH 0.896 05/31/2020: Platelets 236 06/06/2020: BUN 16; Creatinine, Ser 1.20; Hemoglobin 12.7; Potassium 3.7; Sodium 135  Recent Lipid Panel    Component Value Date/Time   CHOL 170 02/01/2020 0929   CHOL 212 (H) 06/22/2012 0946   TRIG 102 02/01/2020 0929   TRIG 174 (H) 01/31/2013 1216   TRIG 136 06/22/2012 0946   HDL 44 02/01/2020 0929   HDL 51 01/31/2013 1216   HDL 49 06/22/2012 0946   CHOLHDL 3.9 02/01/2020 0929   LDLCALC 107 (H) 02/01/2020 0929   LDLCALC 107 (H) 01/31/2013 1216   LDLCALC 136 (H) 06/22/2012 0946    Physical Exam:    VS:  BP 116/80 (BP Location: Left Arm, Patient Position: Sitting, Cuff Size:  Large)   Pulse 76   Ht '6\' 2"'$  (1.88 m)   Wt 300 lb (136.1 kg)   BMI 38.52 kg/m     Wt Readings from Last 3 Encounters:  09/18/20 300 lb (136.1 kg)  06/04/20 (!) 302 lb 0.5 oz (137 kg)  05/31/20 (!) 302 lb (137 kg)     GEN:  in no acute distress HEENT: Normal NECK: No JVD; No carotid bruits CARDIAC: RRR, no murmurs, rubs, gallops RESPIRATORY:  Clear to auscultation without rales, wheezing or rhonchi  ABDOMEN: Soft, non-tender, non-distended MUSCULOSKELETAL:  No edema; No deformity  SKIN: Warm and  dry NEUROLOGIC:  Alert and oriented x 3 PSYCHIATRIC:  Normal affect   ASSESSMENT:    1. CAD in native artery   2. Essential hypertension   3. Hyperlipidemia, unspecified hyperlipidemia type   4. Snoring   5. Aortic dilatation (HCC)     PLAN:     CAD: Reported symptoms consistent with typical angina.  Cardiac catheterization on 02/13/2020 showed severe two-vessel obstructive CAD (85% proximal LCx stenosis, CTO of mid RCA with left-to-right collaterals, 65% ramus, 35% proximal to mid LAD, aneurysmal dilatation of distal left main), normal LV function, mild elevation in LVEDP 20 mmHg.  Treated with DES to proximal LCx.  Echocardiogram 03/16/2020 shows normal biventricular function.  Reports chest pain resolved since his catheterization. -Continue aspirin 81 mg daily, ticagrelor 90 mg twice daily.  Plan DAPT for 1 year through 02/2021 -Continue atorvastatin 80 mg daily -Continue carvedilol 12.5 mg twice daily -As needed sublingual nitroglycerin  Hypertension: On amlodipine 10 mg daily, hydrochlorothiazide 25 mg daily, carvedilol 12.5 mg twice daily, ramipril 10 mg twice daily, spironolactone 25 mg daily.  Suspect untreated OSA contributing to resistant hypertension.  Check sleep study.  Normal aldosterone but elevated renin, will consider ultrasound to evaluate for renal artery stenosis if BP not improved.    Hyperlipidemia: LDL 107 on 02/01/2020.  Switched from simvastatin 40 mg daily to  atorvastatin 80 mg daily for goal LDL less than 70.  Recheck lipid panel  Snoring: Sleep study as above  Aortic dilatation: CTA chest on 03/26/2020 showed mild aortic root dilatation measuring 44 mm, 40 mm in ascending aorta.  Repeat CTA in chest in 1 year to follow  Renal cell carcinoma: noted on CT chest 03/26/20 to have 4.1 x 3.5 cm mass in the right kidney consistent with renal cell carcinoma.  He was referred to urology and underwent partial nephrectomy on 06/04/2020 with Dr. Alinda Money.  RTC in 6 months   Medication Adjustments/Labs and Tests Ordered: Current medicines are reviewed at length with the patient today.  Concerns regarding medicines are outlined above.  Orders Placed This Encounter  Procedures   EKG 12-Lead    No orders of the defined types were placed in this encounter.   Patient Instructions  Medication Instructions:  No changes *If you need a refill on your cardiac medications before your next appointment, please call your pharmacy*   Lab Work: None ordered If you have labs (blood work) drawn today and your tests are completely normal, you will receive your results only by: [place] (if you have MyChart) OR A paper copy in the mail If you have any lab test that is abnormal or we need to change your treatment, we will call you to review the results.   Testing/Procedures: Dr. Gardiner Rhyme has ordered a home sleep study. They will call you set this up.   Follow-Up: At [facility], you and your health needs are our priority.  As part of our continuing mission to provide you with exceptional heart care, we have created designated Provider Care Teams.  These Care Teams include your primary Cardiologist (physician) and Advanced Practice Providers (APPs -  Physician Assistants and Nurse Practitioners) who all work together to provide you with the care you need, when you need it.  We recommend signing up for the patient portal called "MyChart".  Sign up information  is provided on this After Visit Summary.  MyChart is used to connect with patients for Virtual Visits (Telemedicine).  Patients are able to view lab/test results,  encounter notes, upcoming appointments, etc.  Non-urgent messages can be sent to your provider as well.   To learn more about what you can do with MyChart, go to [email or web address].    Your next appointment:   6 month(s)  The format for your next appointment:   In Person  Provider:   Oswaldo Milian, MD    Signed, Donato Heinz, MD  09/18/2020 5:29 PM    [place]

## 2020-09-18 ENCOUNTER — Other Ambulatory Visit: Payer: Self-pay

## 2020-09-18 ENCOUNTER — Ambulatory Visit (INDEPENDENT_AMBULATORY_CARE_PROVIDER_SITE_OTHER): Payer: Managed Care, Other (non HMO) | Admitting: Cardiology

## 2020-09-18 ENCOUNTER — Encounter: Payer: Self-pay | Admitting: Cardiology

## 2020-09-18 VITALS — BP 116/80 | HR 76 | Ht 74.0 in | Wt 300.0 lb

## 2020-09-18 DIAGNOSIS — E785 Hyperlipidemia, unspecified: Secondary | ICD-10-CM

## 2020-09-18 DIAGNOSIS — R0683 Snoring: Secondary | ICD-10-CM | POA: Diagnosis not present

## 2020-09-18 DIAGNOSIS — I10 Essential (primary) hypertension: Secondary | ICD-10-CM | POA: Diagnosis not present

## 2020-09-18 DIAGNOSIS — I2510 Atherosclerotic heart disease of native coronary artery without angina pectoris: Secondary | ICD-10-CM

## 2020-09-18 DIAGNOSIS — I77819 Aortic ectasia, unspecified site: Secondary | ICD-10-CM

## 2020-09-18 NOTE — Patient Instructions (Signed)
Medication Instructions:  No changes *If you need a refill on your cardiac medications before your next appointment, please call your pharmacy*   Lab Work: None ordered If you have labs (blood work) drawn today and your tests are completely normal, you will receive your results only by: [place] (if you have MyChart) OR A paper copy in the mail If you have any lab test that is abnormal or we need to change your treatment, we will call you to review the results.   Testing/Procedures: Dr. Gardiner Rhyme has ordered a home sleep study. They will call you set this up.   Follow-Up: At [facility], you and your health needs are our priority.  As part of our continuing mission to provide you with exceptional heart care, we have created designated Provider Care Teams.  These Care Teams include your primary Cardiologist (physician) and Advanced Practice Providers (APPs -  Physician Assistants and Nurse Practitioners) who all work together to provide you with the care you need, when you need it.  We recommend signing up for the patient portal called "MyChart".  Sign up information is provided on this After Visit Summary.  MyChart is used to connect with patients for Virtual Visits (Telemedicine).  Patients are able to view lab/test results, encounter notes, upcoming appointments, etc.  Non-urgent messages can be sent to your provider as well.   To learn more about what you can do with MyChart, go to [email or web address].    Your next appointment:   6 month(s)  The format for your next appointment:   In Person  Provider:   Oswaldo Milian, MD

## 2020-09-20 ENCOUNTER — Other Ambulatory Visit: Payer: Self-pay | Admitting: Cardiology

## 2020-09-24 ENCOUNTER — Telehealth: Payer: Self-pay | Admitting: *Deleted

## 2020-09-24 NOTE — Telephone Encounter (Signed)
Left message HST  has been scheduled. He can either call me back or check his My chart for details.

## 2020-09-28 ENCOUNTER — Encounter: Payer: Self-pay | Admitting: Family Medicine

## 2020-09-28 ENCOUNTER — Ambulatory Visit: Payer: Managed Care, Other (non HMO) | Admitting: Family Medicine

## 2020-09-28 DIAGNOSIS — I10 Essential (primary) hypertension: Secondary | ICD-10-CM

## 2020-09-28 DIAGNOSIS — F411 Generalized anxiety disorder: Secondary | ICD-10-CM

## 2020-09-28 DIAGNOSIS — E782 Mixed hyperlipidemia: Secondary | ICD-10-CM

## 2020-09-28 MED ORDER — SERTRALINE HCL 50 MG PO TABS: 50.0000 mg | ORAL_TABLET | Freq: Every day | ORAL | 2 refills | Status: DC

## 2020-09-28 NOTE — Progress Notes (Signed)
Virtual Visit via telephone Note  I connected with Jonathan Barrett on 09/28/20 at [place] by telephone and verified that I am speaking with the correct person using two identifiers. Jonathan Barrett is currently located at work and patient are currently with her during visit. The provider, Fransisca Kaufmann Keziah Avis, MD is located in their office at time of visit.  Call ended at [phone]  I discussed the limitations, risks, security and privacy concerns of performing an evaluation and management service by telephone and the availability of in person appointments. I also discussed with the patient that there may be a patient responsible charge related to this service. The patient expressed understanding and agreed to proceed.   History and Present Illness: Hypertension Patient is currently on amlodipine and carvedilol and hctz and ramipril and spironolactone, and their blood pressure today is 110/86. Patient denies any lightheadedness or dizziness. Patient denies headaches, blurred vision, chest pains, shortness of breath, or weakness. Denies any side effects from medication and is content with current medication.   Hyperlipidemia Patient is coming in for recheck of his hyperlipidemia. The patient is currently taking lipitor. They deny any issues with myalgias or history of liver damage from it. They deny any focal numbness or weakness or chest pain.   Patient is started a new managerial job and is have more stressors related to that.  He is fighting this everyday.  He says work is not letting him calm down and relax when he gets off of work. He wakes up sometimes and mind racing keeps him up sometimes.   1. Primary hypertension   2. Mixed hyperlipidemia   3. Morbid obesity ([place])   4. GAD (generalized anxiety disorder)     Outpatient Encounter Medications as of 09/28/2020  Medication Sig   sertraline (ZOLOFT) 50 MG tablet Take 1 tablet (50 mg total) by mouth daily.   amLODipine (NORVASC) 10 MG tablet Take  1 tablet (10 mg total) by mouth daily.   aspirin EC 81 MG tablet Take 1 tablet (81 mg total) by mouth daily. Swallow whole.   atorvastatin (LIPITOR) 80 MG tablet TAKE 1 TABLET BY MOUTH EVERY DAY   carvedilol (COREG) 12.5 MG tablet Take 1 tablet (12.5 mg total) by mouth 2 (two) times daily.   docusate sodium (COLACE) 100 MG capsule Take 1 capsule (100 mg total) by mouth 2 (two) times daily.   hydrochlorothiazide (HYDRODIURIL) 25 MG tablet Take 1 tablet (25 mg total) by mouth daily.   nitroGLYCERIN (NITROSTAT) 0.4 MG SL tablet Place 0.4 mg under the tongue every 5 (five) minutes as needed for chest pain.   ramipril (ALTACE) 10 MG capsule Take 1 capsule (10 mg total) by mouth 2 (two) times daily.   spironolactone (ALDACTONE) 25 MG tablet Take 1 tablet (25 mg total) by mouth daily.   Ticagrelor (BRILINTA PO) Take 1 tablet by mouth in the morning and at bedtime.   traMADol (ULTRAM) 50 MG tablet Take 1-2 tablets (50-100 mg total) by mouth every 6 (six) hours as needed for moderate pain or severe pain.   No facility-administered encounter medications on file as of 09/28/2020.    Review of Systems  Constitutional:  Negative for chills and fever.  Eyes:  Negative for visual disturbance.  Respiratory:  Negative for shortness of breath and wheezing.   Cardiovascular:  Negative for chest pain and leg swelling.  Skin:  Negative for rash.  Neurological:  Negative for dizziness, weakness and light-headedness.  Psychiatric/Behavioral:  Positive for dysphoric mood  and sleep disturbance. Negative for self-injury and suicidal ideas. The patient is nervous/anxious.   All other systems reviewed and are negative.  Observations/Objective: Patient sounds comfortable and in no acute distress  Assessment and Plan: Problem List Items Addressed This Visit       Cardiovascular and Mediastinum   Hypertension - Primary     Other   Hyperlipidemia   Morbid obesity ([place])   Other Visit Diagnoses     GAD  (generalized anxiety disorder)       Relevant Medications   sertraline (ZOLOFT) 50 MG tablet     Patient seems to be doing well, with his cardiologist just a few weeks ago he had good blood pressures and a good checkup, he will come in and do his blood work.  Follow up plan: Return if symptoms worsen or fail to improve, for 1-2 month anxiety.     I discussed the assessment and treatment plan with the patient. The patient was provided an opportunity to ask questions and all were answered. The patient agreed with the plan and demonstrated an understanding of the instructions.   The patient was advised to call back or seek an in-person evaluation if the symptoms worsen or if the condition fails to improve as anticipated.  The above assessment and management plan was discussed with the patient. The patient verbalized understanding of and has agreed to the management plan. Patient is aware to call the clinic if symptoms persist or worsen. Patient is aware when to return to the clinic for a follow-up visit. Patient educated on when it is appropriate to go to the emergency department.    I provided 18 minutes of non-face-to-face time during this encounter.    Worthy Rancher, MD

## 2020-10-22 ENCOUNTER — Other Ambulatory Visit: Payer: Self-pay | Admitting: Family Medicine

## 2020-10-22 DIAGNOSIS — F411 Generalized anxiety disorder: Secondary | ICD-10-CM

## 2020-10-29 ENCOUNTER — Other Ambulatory Visit: Payer: Self-pay

## 2020-10-29 ENCOUNTER — Ambulatory Visit (HOSPITAL_BASED_OUTPATIENT_CLINIC_OR_DEPARTMENT_OTHER): Payer: Managed Care, Other (non HMO) | Attending: Cardiology | Admitting: Cardiovascular Disease

## 2020-10-29 DIAGNOSIS — G4734 Idiopathic sleep related nonobstructive alveolar hypoventilation: Secondary | ICD-10-CM

## 2020-10-29 DIAGNOSIS — G4736 Sleep related hypoventilation in conditions classified elsewhere: Secondary | ICD-10-CM | POA: Insufficient documentation

## 2020-10-29 DIAGNOSIS — R0683 Snoring: Secondary | ICD-10-CM | POA: Diagnosis present

## 2020-10-29 DIAGNOSIS — G4733 Obstructive sleep apnea (adult) (pediatric): Secondary | ICD-10-CM | POA: Diagnosis not present

## 2020-10-29 DIAGNOSIS — I10 Essential (primary) hypertension: Secondary | ICD-10-CM | POA: Insufficient documentation

## 2020-11-08 ENCOUNTER — Other Ambulatory Visit: Payer: Managed Care, Other (non HMO)

## 2020-11-08 ENCOUNTER — Other Ambulatory Visit: Payer: Self-pay

## 2020-11-08 ENCOUNTER — Encounter (HOSPITAL_BASED_OUTPATIENT_CLINIC_OR_DEPARTMENT_OTHER): Payer: Self-pay | Admitting: Cardiovascular Disease

## 2020-11-08 DIAGNOSIS — I10 Essential (primary) hypertension: Secondary | ICD-10-CM

## 2020-11-08 DIAGNOSIS — E782 Mixed hyperlipidemia: Secondary | ICD-10-CM

## 2020-11-08 LAB — CBC WITH DIFFERENTIAL/PLATELET
Basophils Absolute: 0.1 10*3/uL (ref 0.0–0.2)
Basos: 1 %
EOS (ABSOLUTE): 0.3 10*3/uL (ref 0.0–0.4)
Eos: 2 %
Hematocrit: 38.5 % (ref 37.5–51.0)
Hemoglobin: 12.8 g/dL — ABNORMAL LOW (ref 13.0–17.7)
Immature Grans (Abs): 0.1 10*3/uL (ref 0.0–0.1)
Immature Granulocytes: 1 %
Lymphocytes Absolute: 2.4 10*3/uL (ref 0.7–3.1)
Lymphs: 21 %
MCH: 30.3 pg (ref 26.6–33.0)
MCHC: 33.2 g/dL (ref 31.5–35.7)
MCV: 91 fL (ref 79–97)
Monocytes Absolute: 1 10*3/uL — ABNORMAL HIGH (ref 0.1–0.9)
Monocytes: 9 %
Neutrophils Absolute: 7.5 10*3/uL — ABNORMAL HIGH (ref 1.4–7.0)
Neutrophils: 66 %
Platelets: 227 10*3/uL (ref 150–450)
RBC: 4.22 x10E6/uL (ref 4.14–5.80)
RDW: 12.8 % (ref 11.6–15.4)
WBC: 11.3 10*3/uL — ABNORMAL HIGH (ref 3.4–10.8)

## 2020-11-08 LAB — LIPID PANEL
Chol/HDL Ratio: 3 ratio (ref 0.0–5.0)
Cholesterol, Total: 125 mg/dL (ref 100–199)
HDL: 41 mg/dL (ref >39)
LDL Chol Calc (NIH): 63 mg/dL (ref 0–99)
Triglycerides: 117 mg/dL (ref 0–149)
VLDL Cholesterol Cal: 21 mg/dL (ref 5–40)

## 2020-11-08 LAB — CMP14+EGFR
ALT: 29 IU/L (ref 0–44)
AST: 21 IU/L (ref 0–40)
Albumin/Globulin Ratio: 1.3 (ref 1.2–2.2)
Albumin: 4.1 g/dL (ref 3.8–4.9)
Alkaline Phosphatase: 90 IU/L (ref 44–121)
BUN/Creatinine Ratio: 19 (ref 9–20)
BUN: 21 mg/dL (ref 6–24)
Bilirubin Total: 0.4 mg/dL (ref 0.0–1.2)
CO2: 22 mmol/L (ref 20–29)
Calcium: 9.6 mg/dL (ref 8.7–10.2)
Chloride: 95 mmol/L — ABNORMAL LOW (ref 96–106)
Creatinine, Ser: 1.09 mg/dL (ref 0.76–1.27)
Globulin, Total: 3.2 g/dL (ref 1.5–4.5)
Glucose: 113 mg/dL — ABNORMAL HIGH (ref 70–99)
Potassium: 4.5 mmol/L (ref 3.5–5.2)
Sodium: 132 mmol/L — ABNORMAL LOW (ref 134–144)
Total Protein: 7.3 g/dL (ref 6.0–8.5)
eGFR: 80 mL/min/{1.73_m2} (ref >59)

## 2020-11-08 NOTE — Procedures (Signed)
      Patient Name: Jonathan Barrett, Jonathan Barrett Date: 10/29/2020 Gender: Male D.O.B: [birth year] Age (years): 55 Referring Provider: Oswaldo Milian Height (inches): 10 Interpreting Physician: Shelva Majestic MD, ABSM Weight (lbs): 300 RPSGT: Jacolyn Reedy BMI: 40 MRN: [id number] Neck Size: <br>  CLINICAL INFORMATION Sleep Study Type: HST  Indication for sleep study: Snoring,   Epworth Sleepiness Score: 3  SLEEP STUDY TECHNIQUE A multi-channel overnight portable sleep study was performed. The channels recorded were: nasal airflow, thoracic respiratory movement, and oxygen saturation with a pulse oximetry. Snoring was also monitored.  MEDICATIONS Patient self administered medications include: N/A.  SLEEP ARCHITECTURE Patient was studied for 452.2 minutes. The sleep efficiency was 100.0 % and the patient was supine for 63.7%. The arousal index was 0.0 per hour.  RESPIRATORY PARAMETERS The overall AHI was 63.3 per hour, with a central apnea index of 0 per hour.  The oxygen nadir was 72% during sleep.  CARDIAC DATA Mean heart rate during sleep was 77.0 bpm.  IMPRESSIONS - Severe obstructive sleep apnea occurred during this study (AHI  63.3/h). There is a significant positional component with supine sleep AHI 80.9/h versus non-supine AHI 44.1/h. - Severe oxygen desaturation to a nadir of 72%. Time spent < 89% was 59.3 minutes. - Heart rate range during sleep 59 to 113 bpm. - Patient snored 14.0% during the sleep.  DIAGNOSIS - Obstructive Sleep Apnea (G47.33) - Nocturnal Hypoxemia (G47.36)  RECOMMENDATIONS - In this patient with significant cardiovascular comorbidities, severe sleep apnea and marked oxygen desaturation, recommend an expeditious in-lab CPAP titration study.  - Effort should be made to optimize nasal and oropharyngeal patency.  - Positional therapy avoiding supine position during sleep. - Avoid alcohol, sedatives and other CNS depressants that may  worsen sleep apnea and disrupt normal sleep architecture. - Sleep hygiene should be reviewed to assess factors that may improve sleep quality. - Weight management (BMI 40) and regular exercise should be initiated or continued. - Recommend a download in 30 days and sleep clinic evaluation after one month of therapy.    [Electronically signed] 11/08/2020 01:13 PM  Shelva Majestic MD, [facility], ABSM Diplomate, American Board of Sleep Medicine   NPI: [id number] [place] PH: [phone]   FX: [phone] [place]

## 2020-11-12 ENCOUNTER — Ambulatory Visit (INDEPENDENT_AMBULATORY_CARE_PROVIDER_SITE_OTHER): Payer: Managed Care, Other (non HMO) | Admitting: Cardiovascular Disease

## 2020-11-12 ENCOUNTER — Encounter: Payer: Self-pay | Admitting: Cardiovascular Disease

## 2020-11-12 ENCOUNTER — Other Ambulatory Visit: Payer: Self-pay

## 2020-11-12 VITALS — BP 125/76 | HR 74 | Ht 75.0 in | Wt 301.0 lb

## 2020-11-12 DIAGNOSIS — G4730 Sleep apnea, unspecified: Secondary | ICD-10-CM | POA: Diagnosis not present

## 2020-11-12 DIAGNOSIS — R0683 Snoring: Secondary | ICD-10-CM | POA: Diagnosis not present

## 2020-11-12 DIAGNOSIS — I2510 Atherosclerotic heart disease of native coronary artery without angina pectoris: Secondary | ICD-10-CM | POA: Diagnosis not present

## 2020-11-12 DIAGNOSIS — I10 Essential (primary) hypertension: Secondary | ICD-10-CM

## 2020-11-12 DIAGNOSIS — Z85528 Personal history of other malignant neoplasm of kidney: Secondary | ICD-10-CM

## 2020-11-12 NOTE — Progress Notes (Signed)
Cardiology Office Note    Date:  11/19/2020   ID:  Jonathan Barrett, DOB 08-08-1965, MRN [id number]  PCP:  Dettinger, Fransisca Kaufmann, MD  Cardiologist:  Shelva Majestic, MD (sleep); Dr. Oswaldo Milian  New sleep evaluation   History of Present Illness:  Jonathan Barrett is a 55 y.o. male who is followed by Dr. Gardiner Rhyme for cardiology care.  He has a history of CAD and underwent PCI to circumflex vessel on February 13, 2020.  He also has a history of hypertension, hyperlipidemia as well as renal cell carcinoma which was fortuitously picked up on CT imaging of his chest when he was found to have aortic root dilatation but also noted to have a 4.1 x 3.5 cm mass in the right kidney consistent with renal cell carcinoma.  He underwent partial nephrectomy on Jun 04, 2020 with Dr. Alinda Money successfully.  The patient has a history of snoring as well as witnessed apnea.  As result he was referred for a home sleep study which was done on October 29, 2020.  His home study revealed severe sleep apnea with an AHI of 63.3/h and he had severe oxygen desaturation to nadir of 72%.  He is seeing stepfather of our nurse Madelaine Etienne, RN, and is now referred for evaluation and further discussion of his severe sleep apnea prior to initiating CPAP titration.  Upon further questioning, he admits to snoring, he has history of hypertension, he has had witnessed apnea, oftentimes he goes to bed between 9 and 930, wakes up between 2 2:30 AM but cannot go back to sleep thereafter.  He wakes up between 5 and 530 for good and his average sleep duration is usually less than 6 hours.  He works in [employer] as a Administrator.  An Epworth Sleepiness Scale score was calculated in the office today and this endorsed at 3 arguing against excessive daytime sleepiness.  He presents for initial sleep evaluation.    Past Medical History:  Diagnosis Date   Coronary artery disease    DES   Hyperlipidemia    Hypertension      Past Surgical History:  Procedure Laterality Date   CORONARY STENT INTERVENTION N/A 02/13/2020   Procedure: CORONARY STENT INTERVENTION;  Surgeon: Martinique, Peter M, MD;  Location: [facility];  Service: Cardiovascular;  Laterality: N/A;   CYST EXCISION     LEFT HEART CATH AND CORONARY ANGIOGRAPHY N/A 02/13/2020   Procedure: LEFT HEART CATH AND CORONARY ANGIOGRAPHY;  Surgeon: Martinique, Peter M, MD;  Location: [facility];  Service: Cardiovascular;  Laterality: N/A;   MIDDLE EAR SURGERY     ROBOTIC ASSITED PARTIAL NEPHRECTOMY Right 06/04/2020   Procedure: XI ROBOTIC ASSITED PARTIAL NEPHRECTOMY;  Surgeon: Raynelle Bring, MD;  Location: [facility];  Service: Urology;  Laterality: Right;    Current Medications: Outpatient Medications Prior to Visit  Medication Sig Dispense Refill   amLODipine (NORVASC) 10 MG tablet Take 1 tablet (10 mg total) by mouth daily. 90 tablet 3   aspirin EC 81 MG tablet Take 1 tablet (81 mg total) by mouth daily. Swallow whole. 90 tablet 3   atorvastatin (LIPITOR) 80 MG tablet TAKE 1 TABLET BY MOUTH EVERY DAY 90 tablet 1   docusate sodium (COLACE) 100 MG capsule Take 1 capsule (100 mg total) by mouth 2 (two) times daily.     hydrochlorothiazide (HYDRODIURIL) 25 MG tablet Take 1 tablet (25 mg total) by mouth daily. 90 tablet 3   nitroGLYCERIN (NITROSTAT)  0.4 MG SL tablet Place 0.4 mg under the tongue every 5 (five) minutes as needed for chest pain.     ramipril (ALTACE) 10 MG capsule Take 1 capsule (10 mg total) by mouth 2 (two) times daily. 180 capsule 3   sertraline (ZOLOFT) 50 MG tablet TAKE 1 TABLET BY MOUTH EVERY DAY 90 tablet 0   spironolactone (ALDACTONE) 25 MG tablet Take 1 tablet (25 mg total) by mouth daily. 90 tablet 3   Ticagrelor (BRILINTA PO) Take 1 tablet by mouth in the morning and at bedtime.     traMADol (ULTRAM) 50 MG tablet Take 1-2 tablets (50-100 mg total) by mouth every 6 (six) hours as needed for moderate pain or severe pain. 20 tablet 0    carvedilol (COREG) 12.5 MG tablet Take 1 tablet (12.5 mg total) by mouth 2 (two) times daily. 180 tablet 3   No facility-administered medications prior to visit.     Allergies:   Patient has no known allergies.   Social History   Socioeconomic History   Marital status: Married    Spouse name: Not on file   Number of children: Not on file   Years of education: Not on file   Highest education level: Not on file  Occupational History   Not on file  Tobacco Use   Smoking status: Never   Smokeless tobacco: Never  Vaping Use   Vaping Use: Never used  Substance and Sexual Activity   Alcohol use: Yes    Alcohol/week: 12.0 standard drinks    Types: 12 Cans of beer per week    Comment: occas.   Drug use: No   Sexual activity: Yes  Other Topics Concern   Not on file  Social History Narrative   Not on file   Social Determinants of Health   Financial Resource Strain: Not on file  Food Insecurity: Not on file  Transportation Needs: Not on file  Physical Activity: Not on file  Stress: Not on file  Social Connections: Not on file    He is re-married to the mother of Jonathan Barrett, Therapist, sports.   Family History:  The patient's family history includes Heart disease in his father.   ROS General: Negative; No fevers, chills, or night sweats;  HEENT: Negative; No changes in vision or hearing, sinus congestion, difficulty swallowing Pulmonary: Negative; No cough, wheezing, shortness of breath, hemoptysis Cardiovascular: History of CAD status post PCI, hypertension, hyperlipidemia GI: Negative; No nausea, vomiting, diarrhea, or abdominal pain GU: History of renal cell carcinoma, status post partial right nephrectomy Musculoskeletal: Negative; no myalgias, joint pain, or weakness Hematologic/Oncology: Negative; no easy bruising, bleeding Endocrine: Negative; no heat/cold intolerance; no diabetes Neuro: Negative; no changes in balance, headaches Skin: Negative; No rashes or skin  lesions Psychiatric: Negative; No behavioral problems, depression Sleep: Positive for snoring, witnessed apnea, frequent awakenings, No snoring, daytime sleepiness,  bruxism, restless legs, hypnogognic hallucinations, no cataplexy Other comprehensive 14 point system review is negative.   PHYSICAL EXAM:   VS:  BP 125/76   Pulse 74   Ht _0  (1.905 m)   Wt (!) 301 lb (136.5 kg)   SpO2 95%   BMI 37.62 kg/m     Repeat blood pressure by me was 134/76  Wt Readings from Last 3 Encounters:  11/12/20 (!) 301 lb (136.5 kg)  10/29/20 300 lb (136.1 kg)  09/18/20 300 lb (136.1 kg)    General: Alert, oriented, no distress.  Skin: normal turgor, no rashes, warm and dry  HEENT: Normocephalic, atraumatic. Pupils equal round and reactive to light; sclera anicteric; extraocular muscles intact;  Nose without nasal septal hypertrophy Mouth/Parynx benign; Mallinpatti scale 3 Neck: Thick neck; no JVD, no carotid bruits; normal carotid upstroke Lungs: clear to ausculatation and percussion; no wheezing or rales Chest wall: without tenderness to palpitation Heart: PMI not displaced, RRR, s1 s2 normal, 1/6 systolic murmur, no diastolic murmur, no rubs, gallops, thrills, or heaves Abdomen: soft, nontender; no hepatosplenomehaly, BS+; abdominal aorta nontender and not dilated by palpation. Back: no CVA tenderness Pulses 2+ Musculoskeletal: full range of motion, normal strength, no joint deformities Extremities: no clubbing cyanosis or edema, Homan's sign negative  Neurologic: grossly nonfocal; Cranial nerves grossly wnl Psychologic: Normal mood and affect   Studies/Labs Reviewed:   EKG:  EKG is ordered today. ECG (independently read by me):  NSR at 74, nonspecific T change III  Recent Labs: BMP Latest Ref Rng & Units 11/08/2020 06/06/2020 06/05/2020  Glucose 70 - 99 mg/dL 113(H) 121(H) 126(H)  BUN 6 - 24 mg/dL _0 Creatinine 0.76 - 1.27 mg/dL 1.09 1.20 1.26(H)  BUN/Creat Ratio 9 - 20 19 - -   Sodium 134 - 144 mmol/L 132(L) 135 133(L)  Potassium 3.5 - 5.2 mmol/L 4.5 3.7 3.9  Chloride 96 - 106 mmol/L 95(L) 102 101  CO2 20 - 29 mmol/L _1 Calcium 8.7 - 10.2 mg/dL 9.6 8.9 8.4(L)     Hepatic Function Latest Ref Rng & Units 11/08/2020 02/01/2020 11/30/2017  Total Protein 6.0 - 8.5 g/dL 7.3 7.4 7.6  Albumin 3.8 - 4.9 g/dL 4.1 4.5 4.5  AST 0 - 40 IU/L _2 ALT 0 - 44 IU/L _3 Alk Phosphatase 44 - 121 IU/L 90 63 64  Total Bilirubin 0.0 - 1.2 mg/dL 0.4 0.2 0.4    CBC Latest Ref Rng & Units 11/08/2020 06/06/2020 06/05/2020  WBC 3.4 - 10.8 x10E3/uL 11.3(H) - -  Hemoglobin 13.0 - 17.7 g/dL 12.8(L) 12.7(L) 12.6(L)  Hematocrit 37.5 - 51.0 % 38.5 38.4(L) 37.2(L)  Platelets 150 - 450 x10E3/uL 227 - -   Lab Results  Component Value Date   MCV 91 11/08/2020   MCV 92.7 05/31/2020   MCV 93 02/01/2020   Lab Results  Component Value Date   TSH 0.896 02/01/2020   Lab Results  Component Value Date   HGBA1C 5.3 % 01/31/2013     BNP No results found for: BNP  ProBNP No results found for: PROBNP   Lipid Panel     Component Value Date/Time   CHOL 125 11/08/2020 0850   CHOL 212 (H) 06/22/2012 0946   TRIG 117 11/08/2020 0850   TRIG 174 (H) 01/31/2013 1216   TRIG 136 06/22/2012 0946   HDL 41 11/08/2020 0850   HDL 51 01/31/2013 1216   HDL 49 06/22/2012 0946   CHOLHDL 3.0 11/08/2020 0850   LDLCALC 63 11/08/2020 0850   LDLCALC 107 (H) 01/31/2013 1216   LDLCALC 136 (H) 06/22/2012 0946   LABVLDL 21 11/08/2020 0850     RADIOLOGY: SLEEP STUDY DOCUMENTS  Result Date: 11/02/2020 Ordered by an unspecified provider.    Additional studies/ records that were reviewed today include:    Cath/PCI 02/13/2020 Prox LAD to Mid LAD lesion is 35% stenosed. Ramus lesion is 65% stenosed. Prox Cx to Mid Cx lesion is 85% stenosed. Prox RCA to Mid RCA lesion is 100% stenosed. Post intervention, there is a 0% residual stenosis. A drug-eluting stent was  successfully placed  using a STENT RESOLUTE ONYX 3.0X15. The left ventricular systolic function is normal. LV end diastolic pressure is mildly elevated. The left ventricular ejection fraction is 55-65% by visual estimate.   1. 2 vessel obstructive CAD.     - complex 85% proximal LCx lesion    - 100% CTO of the mid RCA with left to right collaterals.     - 65% ramus intermediate.     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 Normal LV function 3. Mildly elevated LVEDP 20 mm Hg 4. Successful PCI of the proximal LCx with DES   Plan: continue aggressive medical therapy and risk factor modification. Needs to lose weight. If he has refractory angina despite optimal medical therapy we could consider CTO PCI of the RCA        10/29/2020 CLINICAL INFORMATION Sleep Study Type: HST   Indication for sleep study: Snoring,    Epworth Sleepiness Score: 3   SLEEP STUDY TECHNIQUE A multi-channel overnight portable sleep study was performed. The channels recorded were: nasal airflow, thoracic respiratory movement, and oxygen saturation with a pulse oximetry. Snoring was also monitored.   MEDICATIONS Patient self administered medications include: N/A.   SLEEP ARCHITECTURE Patient was studied for 452.2 minutes. The sleep efficiency was 100.0 % and the patient was supine for 63.7%. The arousal index was 0.0 per hour.   RESPIRATORY PARAMETERS The overall AHI was 63.3 per hour, with a central apnea index of 0 per hour.   The oxygen nadir was 72% during sleep.   CARDIAC DATA Mean heart rate during sleep was 77.0 bpm.   IMPRESSIONS - Severe obstructive sleep apnea occurred during this study (AHI  63.3/h). There is a significant positional component with supine sleep AHI 80.9/h versus non-supine AHI 44.1/h. - Severe oxygen desaturation to a nadir of 72%. Time spent < 89% was 59.3 minutes. - Heart rate range during sleep 59 to 113 bpm. - Patient snored 14.0% during the sleep.   DIAGNOSIS - Obstructive  Sleep Apnea (G47.33) - Nocturnal Hypoxemia (G47.36)   RECOMMENDATIONS - In this patient with significant cardiovascular comorbidities, severe sleep apnea and marked oxygen desaturation, recommend an expeditious in-lab CPAP titration study.  - Effort should be made to optimize nasal and oropharyngeal patency.  - Positional therapy avoiding supine position during sleep. - Avoid alcohol, sedatives and other CNS depressants that may worsen sleep apnea and disrupt normal sleep architecture. - Sleep hygiene should be reviewed to assess factors that may improve sleep quality. - Weight management (BMI 40) and regular exercise should be initiated or continued. - Recommend a download in 30 days and sleep clinic evaluation after one month of therapy.     ASSESSMENT:    1. Severe sleep apnea   2. Snoring   3. CAD in native artery   4. Morbid obesity ([place])   5. Primary hypertension   6. History of renal carcinoma    PLAN:  Mr. Jonathan Barrett is a 55 year old gentleman who has established CAD and has undergone PCI to his left circumflex vessel in January 2022.  He has history of hypertension and hyperlipidemia and was recently found to have right renal cell carcinoma for which he underwent partial nephrectomy.  Due to concerns for obstructive sleep apnea he was referred for a home study which revealed severe sleep apnea with an AHI of 6 3.3/h and severe oxygen desaturation to a nadir of 72%.  I spent considerable time with him today in the office  and reviewed normal sleep architecture and the potential adverse consequences of untreated sleep apnea.  I specifically discussed its effects on hypertension, potential for nocturnal arrhythmias, increased risk for atrial fibrillation, potential for nocturnal hypoxemia contributing to significant nocturnal ischemia and with increased inflammation potential risk for nocturnal coronary or cerebrovascular ischemic/ACS events.  I discussed with him that preponderance  of picogram sleep occurs in the second half of the night and if he typically wakes up at around 230 and does not go back to sleep or does not use therapy and attempting to go back to sleep sleep apnea typically is more severe and may be the reason for his significant nocturnal hypoxemia noted on his home study.  I also discussed with him implications regarding GERD.  At present, he typically sleeps less than 6 hours.  I discussed also with him potential increased cardiovascular or telemetry associated with sleep duration consistently less than 6 hours.  I discussed its potential contribution towards increased nocturia and discussed the pathophysiology contributing to this.  After much discussion, he now understands why it treatment for his severe sleep apnea is indicated.  He will be set up for an in lab titration due to severity of his sleep apnea and cardiovascular comorbidities but if denied by insurance AutoPap therapy will be initiated.  I will see him within 3 months of CPAP initiation and further recommendations will be made at that time.  Medication Adjustments/Labs and Tests Ordered: Current medicines are reviewed at length with the patient today.  Concerns regarding medicines are outlined above.  Medication changes, Labs and Tests ordered today are listed in the Patient Instructions below. There are no Patient Instructions on file for this visit.   Signed, Shelva Majestic, MD  11/19/2020 6:48 PM    [facility] [address], [place], [place], [place]  [address] Phone: [phone]

## 2020-11-16 ENCOUNTER — Telehealth: Payer: Self-pay | Admitting: *Deleted

## 2020-11-16 NOTE — Telephone Encounter (Signed)
Sleep study results and recommendations were discuss by Dr Claiborne Billings at office visit.

## 2020-11-16 NOTE — Telephone Encounter (Signed)
-----   Message from Troy Sine, MD sent at 11/08/2020  1:18 PM EDT ----- Mariann Laster, please notify pt and set up for expeditious CPAP titration.

## 2020-11-19 ENCOUNTER — Encounter: Payer: Self-pay | Admitting: Cardiovascular Disease

## 2020-11-22 ENCOUNTER — Telehealth: Payer: Self-pay | Admitting: *Deleted

## 2020-11-22 NOTE — Telephone Encounter (Signed)
Prior Authorization for sleep study sent to [facility]. Clinical sent via Fax  to [phone]. Reference #[id number].

## 2020-11-26 NOTE — Telephone Encounter (Signed)
Received a denial from [place]. Will inform ordering provider and get APAP order.

## 2020-11-27 ENCOUNTER — Telehealth: Payer: Self-pay | Admitting: *Deleted

## 2020-11-27 NOTE — Telephone Encounter (Signed)
APAP order faxed to [facility]. 

## 2020-12-21 ENCOUNTER — Other Ambulatory Visit: Payer: Self-pay

## 2020-12-21 ENCOUNTER — Other Ambulatory Visit (HOSPITAL_COMMUNITY): Payer: Self-pay | Admitting: Urology

## 2020-12-21 ENCOUNTER — Ambulatory Visit (HOSPITAL_COMMUNITY)
Admission: RE | Admit: 2020-12-21 | Discharge: 2020-12-21 | Disposition: A | Discharge status: Home / Self Care | Payer: Managed Care, Other (non HMO) | Source: Ambulatory Visit | Attending: Urology | Admitting: Urology

## 2020-12-21 DIAGNOSIS — C649 Malignant neoplasm of unspecified kidney, except renal pelvis: Secondary | ICD-10-CM | POA: Diagnosis not present

## 2021-01-18 ENCOUNTER — Ambulatory Visit: Payer: Managed Care, Other (non HMO) | Admitting: Family Medicine

## 2021-01-18 ENCOUNTER — Telehealth: Payer: Self-pay | Admitting: Family Medicine

## 2021-01-18 NOTE — Telephone Encounter (Signed)
Pt r/s to 1/6 for telephone visit

## 2021-01-18 NOTE — Telephone Encounter (Signed)
Pt was supposed to have appt for med refill today but provider is out sick. Pt unable to take more time off from work. Can he be scheduled for video visit before he runs out of his meds?  Please advise and call patient.

## 2021-01-20 ENCOUNTER — Other Ambulatory Visit: Payer: Self-pay | Admitting: Family Medicine

## 2021-01-20 DIAGNOSIS — F411 Generalized anxiety disorder: Secondary | ICD-10-CM

## 2021-01-22 ENCOUNTER — Telehealth: Payer: Self-pay | Admitting: Cardiovascular Disease

## 2021-01-22 NOTE — Telephone Encounter (Signed)
Routed to primary nurse covering MD on 01/25/21 to advise

## 2021-01-22 NOTE — Telephone Encounter (Signed)
Pt is wanting to know if he can change apt to virtual due to possible inclement weather... please advise.

## 2021-01-23 NOTE — Telephone Encounter (Signed)
Patient states he is returning a call.

## 2021-01-23 NOTE — Telephone Encounter (Signed)
I contacted patient, advised that Dr.Kelly does not do virtual visits anymore- he would rather see patients in office.  Patient states he understood, will try to make it to his appointment and will call if any issues.  I did advise patient to be safe and call if he needed to cancel.  Patient verbalized understanding.

## 2021-01-25 ENCOUNTER — Ambulatory Visit: Payer: Managed Care, Other (non HMO) | Admitting: Cardiovascular Disease

## 2021-02-08 ENCOUNTER — Ambulatory Visit (INDEPENDENT_AMBULATORY_CARE_PROVIDER_SITE_OTHER): Payer: Managed Care, Other (non HMO) | Admitting: Family Medicine

## 2021-02-08 ENCOUNTER — Encounter: Payer: Self-pay | Admitting: Family Medicine

## 2021-02-08 DIAGNOSIS — F411 Generalized anxiety disorder: Secondary | ICD-10-CM

## 2021-02-08 MED ORDER — BUPROPION HCL ER (XL) 150 MG PO TB24: 150.0000 mg | ORAL_TABLET | Freq: Every day | ORAL | 0 refills | Status: DC

## 2021-02-08 NOTE — Progress Notes (Signed)
Virtual Visit via telephone Note  I connected with Jonathan Barrett on 02/08/21 at 1046 by telephone and verified that I am speaking with the correct person using two identifiers. Jonathan Barrett is currently located at home and patient are currently with her during visit. The provider, Fransisca Kaufmann Lesle Faron, MD is located in their office at time of visit.  Call ended at 75  I discussed the limitations, risks, security and privacy concerns of performing an evaluation and management service by telephone and the availability of in person appointments. I also discussed with the patient that there may be a patient responsible charge related to this service. The patient expressed understanding and agreed to proceed.   History and Present Illness: Anxiety Patient is still having anxiety and zoloft is helping some and still has anxiety an is having to feel like he wants more help.  Most anxiety is related to work. Sleeping better on CPAP.  He denies any suicidal ideations.   1. GAD (generalized anxiety disorder)     Outpatient Encounter Medications as of 02/08/2021  Medication Sig   buPROPion (WELLBUTRIN XL) 150 MG 24 hr tablet Take 1 tablet (150 mg total) by mouth daily.   amLODipine (NORVASC) 10 MG tablet Take 1 tablet (10 mg total) by mouth daily.   aspirin EC 81 MG tablet Take 1 tablet (81 mg total) by mouth daily. Swallow whole.   atorvastatin (LIPITOR) 80 MG tablet TAKE 1 TABLET BY MOUTH EVERY DAY   carvedilol (COREG) 12.5 MG tablet Take 1 tablet (12.5 mg total) by mouth 2 (two) times daily.   docusate sodium (COLACE) 100 MG capsule Take 1 capsule (100 mg total) by mouth 2 (two) times daily.   hydrochlorothiazide (HYDRODIURIL) 25 MG tablet Take 1 tablet (25 mg total) by mouth daily.   nitroGLYCERIN (NITROSTAT) 0.4 MG SL tablet Place 0.4 mg under the tongue every 5 (five) minutes as needed for chest pain.   ramipril (ALTACE) 10 MG capsule Take 1 capsule (10 mg total) by mouth 2 (two) times  daily.   spironolactone (ALDACTONE) 25 MG tablet Take 1 tablet (25 mg total) by mouth daily.   Ticagrelor (BRILINTA PO) Take 1 tablet by mouth in the morning and at bedtime.   traMADol (ULTRAM) 50 MG tablet Take 1-2 tablets (50-100 mg total) by mouth every 6 (six) hours as needed for moderate pain or severe pain.   [DISCONTINUED] sertraline (ZOLOFT) 50 MG tablet TAKE 1 TABLET BY MOUTH EVERY DAY   No facility-administered encounter medications on file as of 02/08/2021.    Review of Systems  Constitutional:  Negative for chills and fever.  Respiratory:  Negative for shortness of breath and wheezing.   Cardiovascular:  Negative for chest pain and leg swelling.  Musculoskeletal:  Negative for back pain and gait problem.  Skin:  Negative for rash.  Neurological:  Negative for dizziness, weakness and light-headedness.  All other systems reviewed and are negative.  Observations/Objective: Patient sounds comfortable and in no acute distress  Assessment and Plan: Problem List Items Addressed This Visit   None Visit Diagnoses     GAD (generalized anxiety disorder)    -  Primary   Relevant Medications   buPROPion (WELLBUTRIN XL) 150 MG 24 hr tablet       Taper sertraline and start wellbutrin Follow up plan: Return if symptoms worsen or fail to improve, for 2-3 month anxiety.     I discussed the assessment and treatment plan with the patient. The patient was  provided an opportunity to ask questions and all were answered. The patient agreed with the plan and demonstrated an understanding of the instructions.   The patient was advised to call back or seek an in-person evaluation if the symptoms worsen or if the condition fails to improve as anticipated.  The above assessment and management plan was discussed with the patient. The patient verbalized understanding of and has agreed to the management plan. Patient is aware to call the clinic if symptoms persist or worsen. Patient is aware when  to return to the clinic for a follow-up visit. Patient educated on when it is appropriate to go to the emergency department.    I provided 10 minutes of non-face-to-face time during this encounter.    Worthy Rancher, MD

## 2021-03-25 ENCOUNTER — Other Ambulatory Visit: Payer: Self-pay | Admitting: Cardiology

## 2021-03-25 ENCOUNTER — Other Ambulatory Visit: Payer: Self-pay | Admitting: Family Medicine

## 2021-03-26 IMAGING — CT CT ANGIO CHEST
3 of 8 series · 18 of 46 positions shown · IV contrast (omnipaque)
Comparison: None.

CLINICAL DATA: Thoracic aortic aneurysm.

EXAM:
CT ANGIOGRAPHY CHEST WITH CONTRAST
TECHNIQUE: Multidetector CT imaging of the chest was performed using the
standard protocol during bolus administration of intravenous
contrast. Multiplanar CT image reconstructions and MIPs were
obtained to evaluate the vascular anatomy.
CONTRAST:  100mL OMNIPAQUE IOHEXOL 350 MG/ML SOLN

[Series 4: aorta 3.0 bf37 2 · axial · 0.86mm/px · z∈[-352,-30]mm · 13 of 125 slices shown]
[im 9/125  lung]
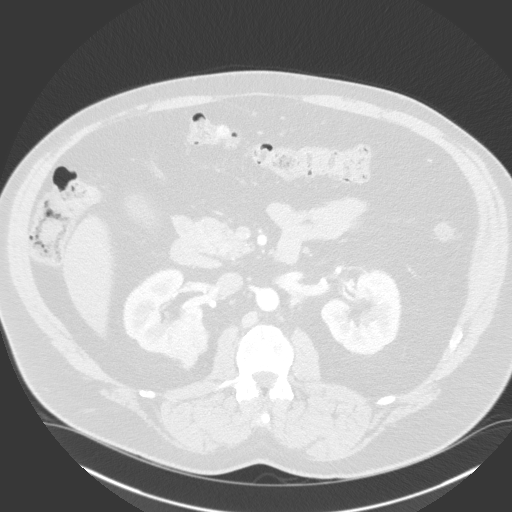
[im 18/125  soft-tissue]
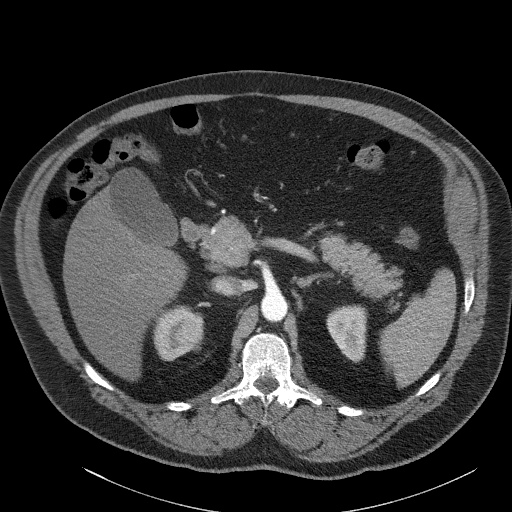
[im 27/125  lung]
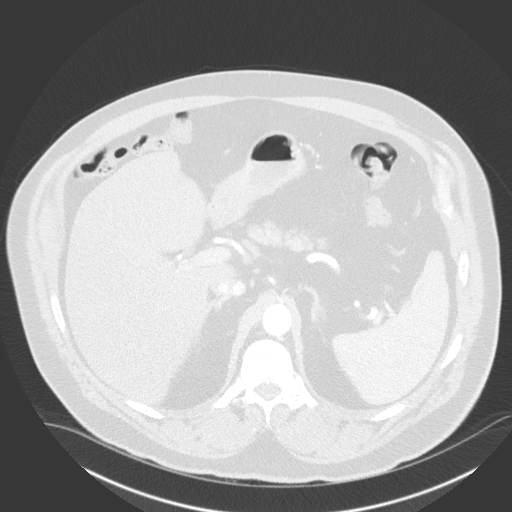
[im 36/125  soft-tissue]
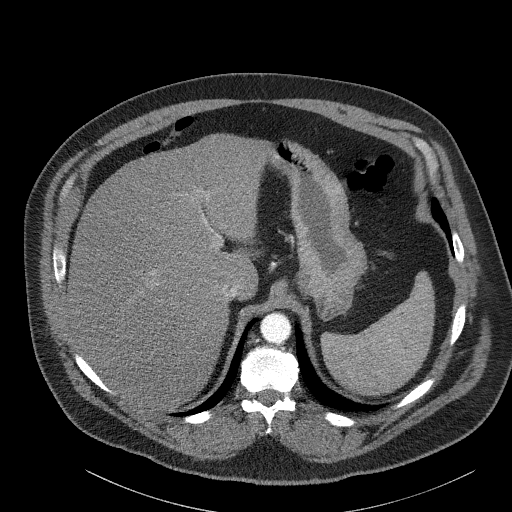
[im 45/125  lung]
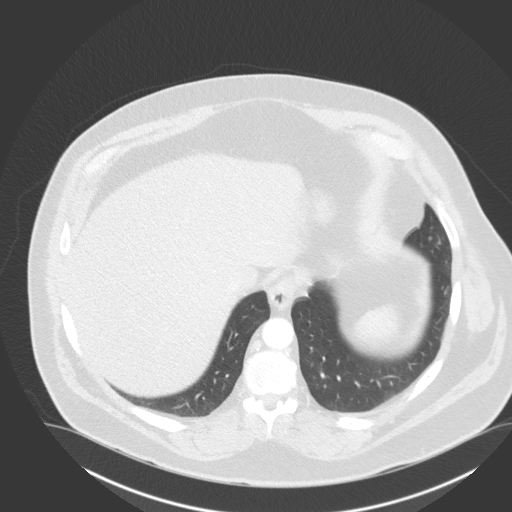
[im 54/125  soft-tissue]
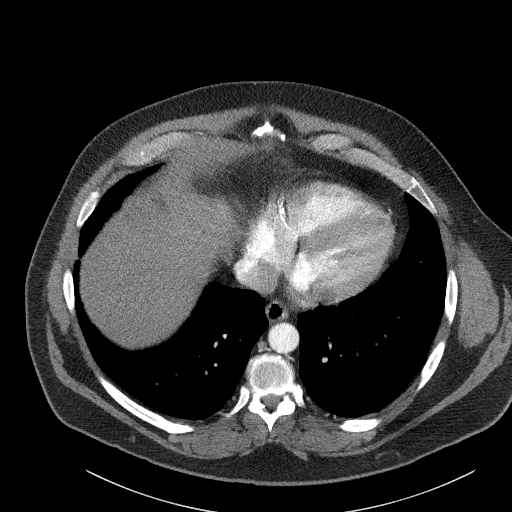
[im 63/125  lung]
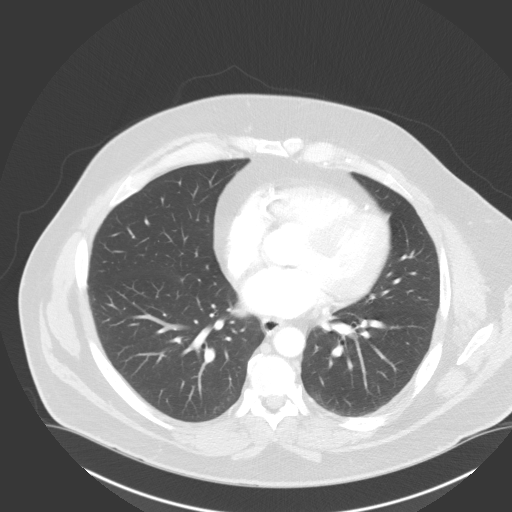
[im 71/125  soft-tissue]
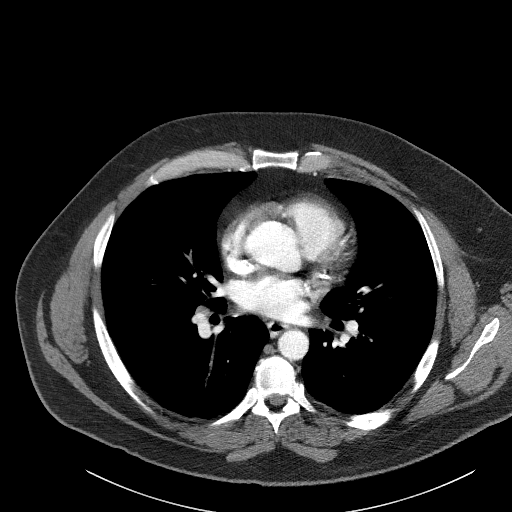
[im 80/125  lung]
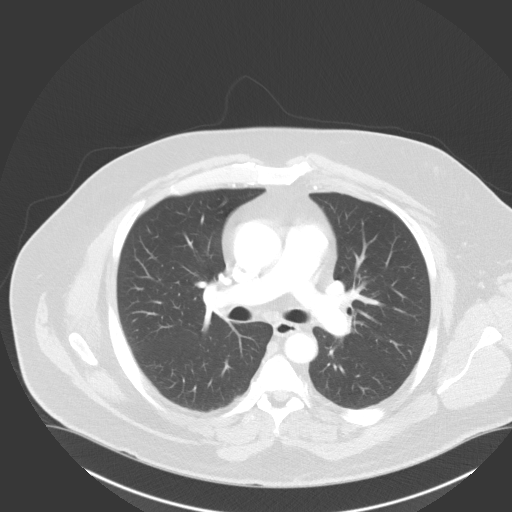
[im 89/125  soft-tissue]
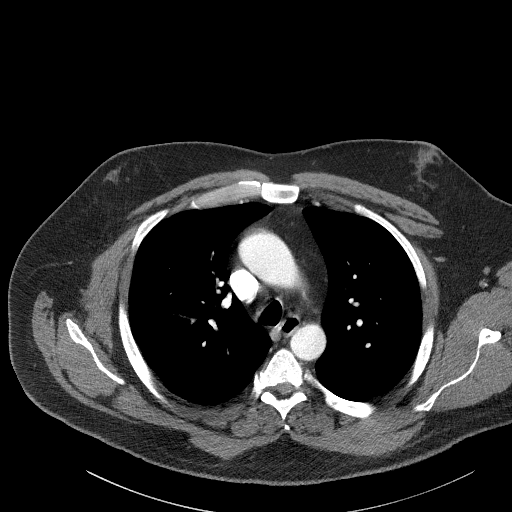
[im 98/125  lung]
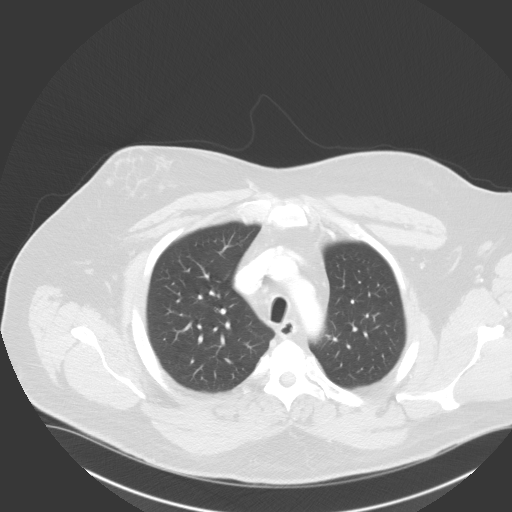
[im 107/125  soft-tissue]
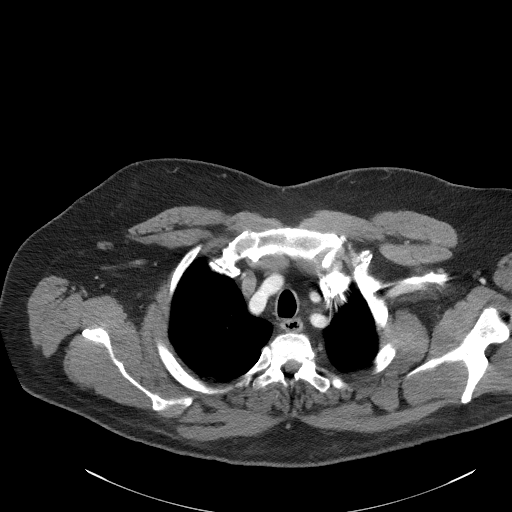
[im 116/125  lung]
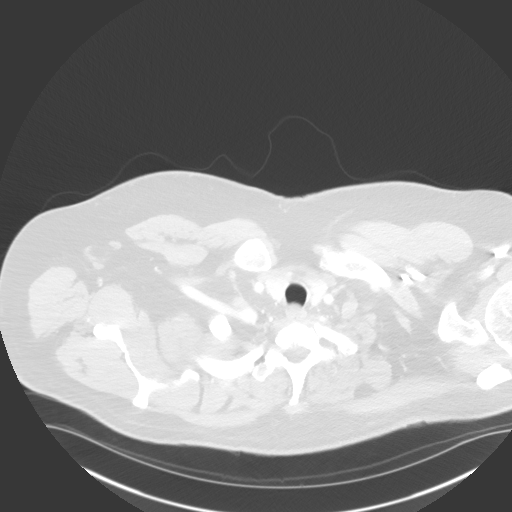

[Series 5: lung · axial · 0.86mm/px · z∈[-352,-298]mm · 2 of 125 slices shown]
[im 9/125  soft-tissue]
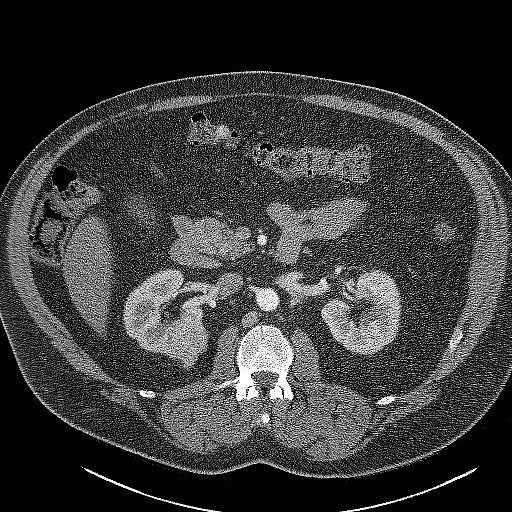
[im 27/125  soft-tissue]
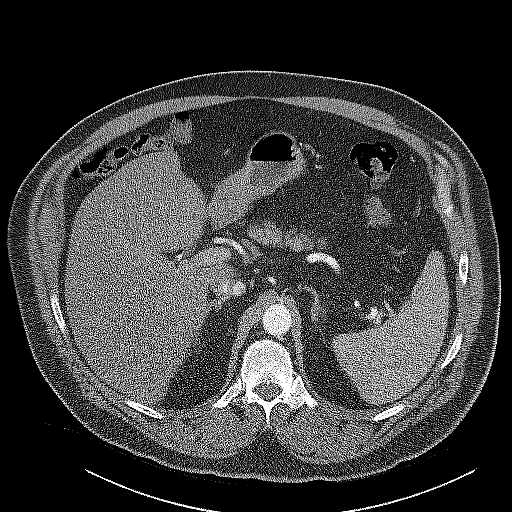

[Series 7: coronals · coronal · 0.74mm/px · 3 of 177 slices shown]
[im 45/177  soft-tissue]
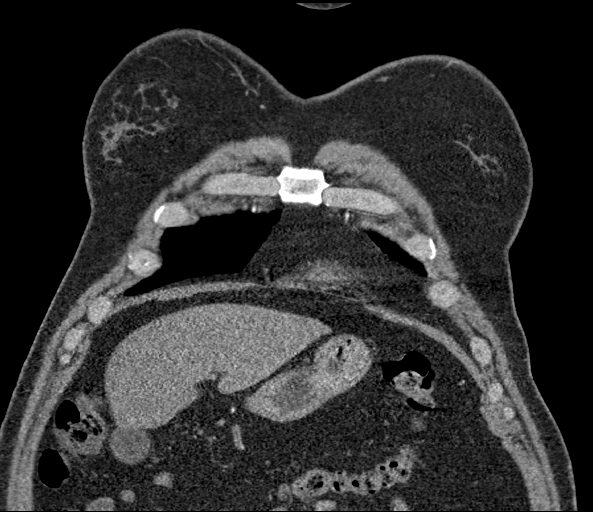
[im 89/177  soft-tissue]
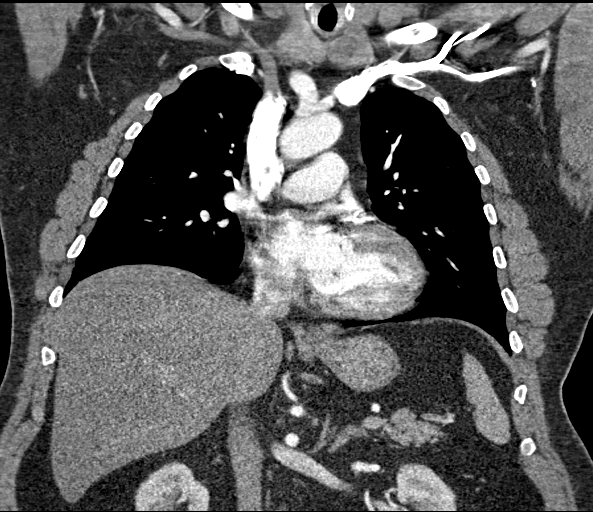
[im 133/177  soft-tissue]
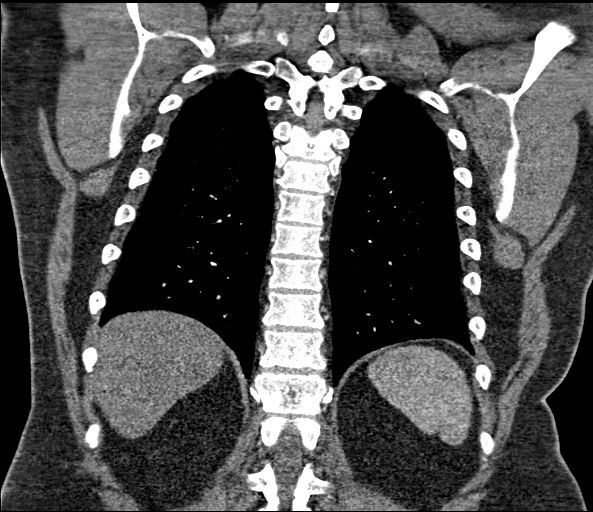

[18 of 46 positions shown; findings below may reference images not displayed]

FINDINGS: Cardiovascular: Aortic root is mildly dilated at 4.4 cm. 4.0 cm
ascending thoracic aortic aneurysm is noted. No dissection is noted.
Transverse tear arch measures 2.7 cm. Proximal descending thoracic
aorta measures 2.8 cm. Great vessels are widely patent without
stenosis. Normal cardiac size. No pericardial effusion. Mild
coronary artery calcifications are noted.

Mediastinum/Nodes: No enlarged mediastinal, hilar, or axillary lymph
nodes. Thyroid gland, trachea, and esophagus demonstrate no
significant findings.

Lungs/Pleura: Lungs are clear. No pleural effusion or pneumothorax.

Upper Abdomen: [DATE] x 3.5 cm irregularly enhancing mass is seen
arising from the midpole of the right kidney consistent with renal
cell carcinoma.

Musculoskeletal: No chest wall abnormality. No acute or significant
osseous findings.

Review of the MIP images confirms the above findings.
IMPRESSION: 1. 4.1 x 3.5 cm irregularly enhancing mass is seen arising from the
midpole of the right kidney consistent with renal cell carcinoma.
Urologic consultation is recommended. These results will be called
to the ordering clinician or representative by the Radiologist
Assistant, and communication documented in the PACS or zVision
Dashboard.
2. Aortic root is mildly dilated at 4.4 cm. 4.0 cm ascending
thoracic aortic aneurysm is noted. No dissection is noted. Recommend
annual imaging followup by CTA or MRA. This recommendation follows
7989 ACCF/AHA/AATS/ACR/ASA/SCA/TACO/DILE KOLA/KHILA/TIGER Guidelines for the
Diagnosis and Management of Patients with Thoracic Aortic Disease.
Circulation. 7989; 121: [id number]. Aortic aneurysm NOS
([id number].Y).
3. Mild coronary artery calcifications are noted.
4. Aortic atherosclerosis.

Aortic Atherosclerosis ([id number].F).

## 2021-04-21 ENCOUNTER — Other Ambulatory Visit: Payer: Self-pay | Admitting: Family Medicine

## 2021-04-21 DIAGNOSIS — F411 Generalized anxiety disorder: Secondary | ICD-10-CM

## 2021-05-09 ENCOUNTER — Other Ambulatory Visit: Payer: Self-pay | Admitting: Family Medicine

## 2021-05-09 DIAGNOSIS — F411 Generalized anxiety disorder: Secondary | ICD-10-CM

## 2021-05-19 NOTE — Progress Notes (Signed)
?Cardiology Office Note:   ? ?Date:  05/23/2021  ? ?ID:  Jonathan Barrett, DOB May 14, 1965, MRN [id number] ? ?PCP:  Dettinger, Fransisca Kaufmann, MD  ?Cardiologist:  Donato Heinz, MD  ?Electrophysiologist:  None  ? ?Referring MD: Dettinger, Fransisca Kaufmann, MD  ? ?Chief Complaint  ?Patient presents with  ? Coronary Artery Disease  ? ? ?History of Present Illness:   ? ?Jonathan Barrett is a 56 y.o. male with a hx of CAD status post LCx PCI 02/13/2020, renal cell carcinoma, hypertension, hyperlipidemia who presents for follow-up.  He was initially seen on 02/01/2020.  Reported symptoms consistent with typical angina.  Cardiac catheterization on 02/13/2020 showed severe two-vessel obstructive CAD (85% proximal LCx stenosis, CTO of mid RCA with left-to-right collaterals, 65% ramus, 35% proximal to mid LAD, aneurysmal dilatation of distal left main), normal LV function, mild elevation in LVEDP 20 mmHg.  Treated with DES to proximal LCx.  Echocardiogram on 03/17/2019 showed normal biventricular function, grade 1 diastolic dysfunction, mild dilatation of aortic root measuring 42 mm.  CTA chest on 03/26/2020 showed mild aortic root dilatation measuring 44 mm, 40 mm in ascending aorta.  Also noted to have 4.1 x 3.5 cm mass in the right kidney consistent with renal cell carcinoma.  He was referred to urology and underwent partial nephrectomy on 06/04/2020 with Dr. Alinda Money. ? ?Since last clinic visit, he reports that he is doing well.  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  Having issues with CPAP during allergy season.  Denies any chest pain, dyspnea, lightheadedness, syncope, lower extremity edema, or palpitations.   ? ?Wt Readings from Last 3 Encounters:  ?05/23/21 (!) 334 lb (151.5 kg)  ?11/12/20 (!) 301 lb (136.5 kg)  ?10/29/20 300 lb (136.1 kg)  ? ? ? ?Past Medical History:  ?Diagnosis Date  ? Coronary artery disease   ? DES  ? Hyperlipidemia   ? Hypertension   ? ? ?Past Surgical History:  ?Procedure Laterality Date  ? CORONARY  STENT INTERVENTION N/A 02/13/2020  ? Procedure: CORONARY STENT INTERVENTION;  Surgeon: Martinique, Peter M, MD;  Location: [facility];  Service: Cardiovascular;  Laterality: N/A;  ? CYST EXCISION    ? LEFT HEART CATH AND CORONARY ANGIOGRAPHY N/A 02/13/2020  ? Procedure: LEFT HEART CATH AND CORONARY ANGIOGRAPHY;  Surgeon: Martinique, Peter M, MD;  Location: [facility];  Service: Cardiovascular;  Laterality: N/A;  ? MIDDLE EAR SURGERY    ? ROBOTIC ASSITED PARTIAL NEPHRECTOMY Right 06/04/2020  ? Procedure: XI ROBOTIC ASSITED PARTIAL NEPHRECTOMY;  Surgeon: Raynelle Bring, MD;  Location: [facility];  Service: Urology;  Laterality: Right;  ? ? ?Current Medications: ?Current Meds  ?Medication Sig  ? amLODipine (NORVASC) 10 MG tablet TAKE 1 TABLET BY MOUTH EVERY DAY  ? aspirin EC 81 MG tablet Take 1 tablet (81 mg total) by mouth daily. Swallow whole.  ? atorvastatin (LIPITOR) 80 MG tablet TAKE 1 TABLET BY MOUTH EVERY DAY  ? buPROPion (WELLBUTRIN XL) 150 MG 24 hr tablet Take 1 tablet (150 mg total) by mouth daily. (NEEDS TO BE SEEN BEFORE NEXT REFILL)  ? carvedilol (COREG) 12.5 MG tablet TAKE 1 TABLET BY MOUTH 2 TIMES DAILY.  ? docusate sodium (COLACE) 100 MG capsule Take 1 capsule (100 mg total) by mouth 2 (two) times daily.  ? hydrochlorothiazide (HYDRODIURIL) 25 MG tablet TAKE 1 TABLET (25 MG TOTAL) BY MOUTH DAILY.  ? ramipril (ALTACE) 10 MG capsule TAKE 1 CAPSULE BY MOUTH 2  TIMES DAILY.  ? spironolactone (ALDACTONE) 25 MG tablet TAKE 1 TABLET (25 MG TOTAL) BY MOUTH DAILY.  ? traMADol (ULTRAM) 50 MG tablet Take 1-2 tablets (50-100 mg total) by mouth every 6 (six) hours as needed for moderate pain or severe pain.  ? [DISCONTINUED] BRILINTA 90 MG TABS tablet TAKE 1 TABLET BY MOUTH 2 TIMES DAILY.  ? [DISCONTINUED] Ticagrelor (BRILINTA PO) Take 1 tablet by mouth in the morning and at bedtime.  ?  ? ?Allergies:   Patient has no known allergies.  ? ?Social History  ? ?Socioeconomic History  ? Marital status: Married  ?  Spouse  name: Not on file  ? Number of children: Not on file  ? Years of education: Not on file  ? Highest education level: Not on file  ?Occupational History  ? Not on file  ?Tobacco Use  ? Smoking status: Never  ? Smokeless tobacco: Never  ?Vaping Use  ? Vaping Use: Never used  ?Substance and Sexual Activity  ? Alcohol use: Yes  ?  Alcohol/week: 12.0 standard drinks  ?  Types: 12 Cans of beer per week  ?  Comment: occas.  ? Drug use: No  ? Sexual activity: Yes  ?Other Topics Concern  ? Not on file  ?Social History Narrative  ? Not on file  ? ?Social Determinants of Health  ? ?Financial Resource Strain: Not on file  ?Food Insecurity: Not on file  ?Transportation Needs: Not on file  ?Physical Activity: Not on file  ?Stress: Not on file  ?Social Connections: Not on file  ?  ? ?Family History: ?The patient's family history includes Heart disease in his father. ? ?ROS:   ?Please see the history of present illness.    ? All other systems reviewed and are negative. ? ?EKGs/Labs/Other Studies Reviewed:   ? ?The following studies were reviewed today: ? ? ?EKG:   ?05/23/2021: Normal sinus rhythm, rate 90, no ST abnormalities ? ?Recent Labs: ?11/08/2020: ALT 29; BUN 21; Creatinine, Ser 1.09; Hemoglobin 12.8; Platelets 227; Potassium 4.5; Sodium 132  ?Recent Lipid Panel ?   ?Component Value Date/Time  ? CHOL 125 11/08/2020 0850  ? CHOL 212 (H) 06/22/2012 0946  ? TRIG 117 11/08/2020 0850  ? TRIG 174 (H) 01/31/2013 1216  ? TRIG 136 06/22/2012 0946  ? HDL 41 11/08/2020 0850  ? HDL 51 01/31/2013 1216  ? HDL 49 06/22/2012 0946  ? CHOLHDL 3.0 11/08/2020 0850  ? [place] 63 11/08/2020 0850  ? LDLCALC 107 (H) 01/31/2013 1216  ? LDLCALC 136 (H) 06/22/2012 0946  ? ? ?Physical Exam:   ? ?VS:  BP 114/76 (BP Location: Left Arm, Patient Position: Sitting, Cuff Size: Large)   Pulse 90   Ht '6\' 3"'$  (1.905 m)   Wt (!) 334 lb (151.5 kg)   SpO2 95%   BMI 41.75 kg/m?    ? ?Wt Readings from Last 3 Encounters:  ?05/23/21 (!) 334 lb (151.5 kg)  ?11/12/20  (!) 301 lb (136.5 kg)  ?10/29/20 300 lb (136.1 kg)  ?  ? ?GEN:  in no acute distress ?HEENT: Normal ?NECK: No JVD; No carotid bruits ?CARDIAC: RRR, no murmurs, rubs, gallops ?RESPIRATORY:  Clear to auscultation without rales, wheezing or rhonchi  ?ABDOMEN: Soft, non-tender, non-distended ?MUSCULOSKELETAL:  No edema; No deformity  ?SKIN: Warm and dry ?NEUROLOGIC:  Alert and oriented x 3 ?PSYCHIATRIC:  Normal affect  ? ?ASSESSMENT:   ? ?1. CAD in native artery   ?2. Morbid obesity ([facility])   ?  3. Primary hypertension   ?4. Essential hypertension   ?5. Aortic dilatation ([facility])   ? ? ? ?PLAN:   ? ? ?CAD: Reported symptoms consistent with typical angina.  Cardiac catheterization on 02/13/2020 showed severe two-vessel obstructive CAD (85% proximal LCx stenosis, CTO of mid RCA with left-to-right collaterals, 65% ramus, 35% proximal to mid LAD, aneurysmal dilatation of distal left main), normal LV function, mild elevation in LVEDP 20 mmHg.  Treated with DES to proximal LCx.  Echocardiogram 03/16/2020 shows normal biventricular function.  Reports chest pain resolved since his catheterization. ?-On aspirin 81 mg daily, ticagrelor 90 mg twice daily.  He has completed 1 year of DAPT, will discontinue ticagrelor.  Continue aspirin 81 mg daily indefinitely ?-Continue atorvastatin 80 mg daily ?-Continue carvedilol 12.5 mg twice daily ?-As needed sublingual nitroglycerin ? ?Hypertension: On amlodipine 10 mg daily, hydrochlorothiazide 25 mg daily, carvedilol 12.5 mg twice daily, ramipril 10 mg twice daily, spironolactone 25 mg daily.  Suspect untreated OSA contributing to resistant hypertension.  BP is improved since starting CPAP.  Currently appears controlled.  Check BMET, magnesium ? ?Hyperlipidemia: LDL 107 on 02/01/2020.  Switched from simvastatin 40 mg daily to atorvastatin 80 mg daily for goal LDL less than 70.  LDL 63 on 11/08/2020 ? ?OSA: Sleep study on 10/29/2020 showed severe OSA, started CPAP ? ?Aortic dilatation: CTA chest on  03/26/2020 showed mild aortic root dilatation measuring 44 mm, 40 mm in ascending aorta.  Repeat CTA in chest to follow ? ?Renal cell carcinoma: noted on CT chest 03/26/20 to have 4.1 x 3.5 cm mass in the righ

## 2021-05-23 ENCOUNTER — Encounter: Payer: Self-pay | Admitting: Cardiology

## 2021-05-23 ENCOUNTER — Ambulatory Visit: Payer: Managed Care, Other (non HMO) | Admitting: Cardiology

## 2021-05-23 VITALS — BP 114/76 | HR 90 | Ht 75.0 in | Wt 334.0 lb

## 2021-05-23 DIAGNOSIS — I77819 Aortic ectasia, unspecified site: Secondary | ICD-10-CM

## 2021-05-23 DIAGNOSIS — I2510 Atherosclerotic heart disease of native coronary artery without angina pectoris: Secondary | ICD-10-CM | POA: Diagnosis not present

## 2021-05-23 DIAGNOSIS — I10 Essential (primary) hypertension: Secondary | ICD-10-CM | POA: Diagnosis not present

## 2021-05-23 NOTE — Patient Instructions (Signed)
Medication Instructions:  ? ?DISCONTINUE Brilinta. ? ?*If you need a refill on your cardiac medications before your next appointment, please call your pharmacy* ? ? ?Lab Work: ? ?TODAY!!!!! BMET/MAG ? ?If you have labs (blood work) drawn today and your tests are completely normal, you will receive your results only by: ?MyChart Message (if you have MyChart) OR ?A paper copy in the mail ?If you have any lab test that is abnormal or we need to change your treatment, we will call you to review the results. ? ? ?Testing/Procedures: ? ?Non-Cardiac CT Angiography (CTA), is a special type of CT scan that uses a computer to produce multi-dimensional views of major blood vessels throughout the body. In CT angiography, a contrast material is injected through an IV to help visualize the blood vessels (CT AORTA W/W/O) [facility].  ? ? ? ?Follow-Up: ?At [facility], you and your health needs are our priority.  As part of our continuing mission to provide you with exceptional heart care, we have created designated Provider Care Teams.  These Care Teams include your primary Cardiologist (physician) and Advanced Practice Providers (APPs -  Physician Assistants and Nurse Practitioners) who all work together to provide you with the care you need, when you need it. ? ?We recommend signing up for the patient portal called "MyChart".  Sign up information is provided on this After Visit Summary.  MyChart is used to connect with patients for Virtual Visits (Telemedicine).  Patients are able to view lab/test results, encounter notes, upcoming appointments, etc.  Non-urgent messages can be sent to your provider as well.   ?To learn more about what you can do with MyChart, go to [email or web address].   ? ?Your next appointment:   ?6 month(s) ? ?The format for your next appointment:   ?In Person ? ?Provider:   ?Donato Heinz, MD   ? ? ?Other Instructions ? ?You have been referred to Pharm-D for weight management.   ? ? ?Important Information About Sugar ? ? ? ? ?  ?

## 2021-05-24 LAB — BASIC METABOLIC PANEL
BUN/Creatinine Ratio: 16 (ref 9–20)
BUN: 20 mg/dL (ref 6–24)
CO2: 21 mmol/L (ref 20–29)
Calcium: 9.9 mg/dL (ref 8.7–10.2)
Chloride: 100 mmol/L (ref 96–106)
Creatinine, Ser: 1.29 mg/dL — ABNORMAL HIGH (ref 0.76–1.27)
Glucose: 94 mg/dL (ref 70–99)
Potassium: 4.5 mmol/L (ref 3.5–5.2)
Sodium: 139 mmol/L (ref 134–144)
eGFR: 65 mL/min/{1.73_m2} (ref >59)

## 2021-05-24 LAB — MAGNESIUM: Magnesium: 2 mg/dL (ref 1.6–2.3)

## 2021-05-30 ENCOUNTER — Ambulatory Visit (INDEPENDENT_AMBULATORY_CARE_PROVIDER_SITE_OTHER): Payer: Managed Care, Other (non HMO) | Admitting: Pharmacist

## 2021-05-30 DIAGNOSIS — Z6841 Body Mass Index (BMI) 40.0 and over, adult: Secondary | ICD-10-CM | POA: Diagnosis not present

## 2021-05-30 NOTE — Patient Instructions (Addendum)
It was nice meeting you today ? ?We will check your A1c today to verify you are not diabetic ? ?If not, we can plan to start a prior authorization for [facility] ? ?If approved I will contact you to let you know ? ?Your dose will be 0.'25mg'$  once a week for 4 weeks and then increase to 0.'5mg'$  ? ?While taking the medication, concentrate on portion sizes and reducing high fat meals ? ?Please call with any questions! ? ?Karren Cobble, PharmD, BCACP, [place], CPP ?[address], [address] ?[place], [place], [address] ?Phone: [phone], Fax: [phone]  ?

## 2021-05-30 NOTE — Progress Notes (Signed)
Patient ID: Jonathan Barrett                 DOB: 12/09/1965                    MRN: [id number] ? ? ? ? ?HPI: ?Jonathan Barrett is a 56 y.o. male patient referred to pharmacy clinic by Dr Gardiner Rhyme to initiate weight loss therapy with GLP1-RA. PMH is significant for obesity, HTN, obesity, and HLD. Most recent BMI 42.25. ? ?Patient presents today in good spirits. Works as a Development worker, international aid for [employer] so is physically active in the summer but not as much in the winter.  Reports he and his wife went on a low carb diet and he lost about 30 pounds until they got Covid in early 2020 and he gained it back.  Has now gained 30 pounds since then. ? ?Reports he eats a healthy diet. Chicken, salmon, and vegetables. Does not drink sugary drinks and does not eat sweets. ? ?Does not have current A1c on file. ? ?Current meds that may affect weight: ? ?Baseline weight/BMI: 42.25 ? ?Labs: ?Lab Results  ?Component Value Date  ? HGBA1C 5.3 % 01/31/2013  ? ? ?Wt Readings from Last 1 Encounters:  ?05/23/21 (!) 334 lb (151.5 kg)  ? ? ?BP Readings from Last 1 Encounters:  ?05/23/21 114/76  ? ?Pulse Readings from Last 1 Encounters:  ?05/23/21 90  ? ? ?   ?Component Value Date/Time  ? CHOL 125 11/08/2020 0850  ? CHOL 212 (H) 06/22/2012 0946  ? TRIG 117 11/08/2020 0850  ? TRIG 174 (H) 01/31/2013 1216  ? TRIG 136 06/22/2012 0946  ? HDL 41 11/08/2020 0850  ? HDL 51 01/31/2013 1216  ? HDL 49 06/22/2012 0946  ? CHOLHDL 3.0 11/08/2020 0850  ? [place] 63 11/08/2020 0850  ? LDLCALC 107 (H) 01/31/2013 1216  ? LDLCALC 136 (H) 06/22/2012 0946  ? ? ?Past Medical History:  ?Diagnosis Date  ? Coronary artery disease   ? DES  ? Hyperlipidemia   ? Hypertension   ? ? ?Current Outpatient Medications on File Prior to Visit  ?Medication Sig Dispense Refill  ? amLODipine (NORVASC) 10 MG tablet TAKE 1 TABLET BY MOUTH EVERY DAY 90 tablet 3  ? aspirin EC 81 MG tablet Take 1 tablet (81 mg total) by mouth daily. Swallow whole. 90 tablet 3  ? atorvastatin (LIPITOR) 80 MG tablet  TAKE 1 TABLET BY MOUTH EVERY DAY 90 tablet 1  ? buPROPion (WELLBUTRIN XL) 150 MG 24 hr tablet Take 1 tablet (150 mg total) by mouth daily. (NEEDS TO BE SEEN BEFORE NEXT REFILL) 30 tablet 0  ? carvedilol (COREG) 12.5 MG tablet TAKE 1 TABLET BY MOUTH 2 TIMES DAILY. 180 tablet 3  ? docusate sodium (COLACE) 100 MG capsule Take 1 capsule (100 mg total) by mouth 2 (two) times daily.    ? hydrochlorothiazide (HYDRODIURIL) 25 MG tablet TAKE 1 TABLET (25 MG TOTAL) BY MOUTH DAILY. 90 tablet 0  ? nitroGLYCERIN (NITROSTAT) 0.4 MG SL tablet Place 0.4 mg under the tongue every 5 (five) minutes as needed for chest pain. (Patient not taking: Reported on 05/23/2021)    ? ramipril (ALTACE) 10 MG capsule TAKE 1 CAPSULE BY MOUTH 2 TIMES DAILY. 180 capsule 3  ? spironolactone (ALDACTONE) 25 MG tablet TAKE 1 TABLET (25 MG TOTAL) BY MOUTH DAILY. 90 tablet 3  ? traMADol (ULTRAM) 50 MG tablet Take 1-2 tablets (50-100 mg total) by mouth every 6 (six) hours as  needed for moderate pain or severe pain. 20 tablet 0  ? ?No current facility-administered medications on file prior to visit.  ? ? ?No Known Allergies ? ? ?Assessment/Plan: ? ?1. Weight loss - Patient is interested in medical weight loss therapy since he has not lost weight with diet and increased physical activity.  Explained the mechanism of action of Ozempic/Wegovy and advised we should update A1c to confirm not diabetic.  Confirmed has no personal or family history of medullary thyroid carcinoma (MTC) or Multiple Endocrine Neoplasia syndrome type 2 (MEN 2).  ? ?Advised patient on common side effects including nausea, diarrhea, dyspepsia, decreased appetite, and fatigue. Counseled patient on reducing meal size and how to titrate medication to minimize side effects. Counseled patient to call if intolerable side effects or if experiencing dehydration, abdominal pain, or dizziness. Patient will adhere to dietary modifications and will target at least 150 minutes of moderate intensity  exercise weekly.  ? ?Will complete PA for [facility] and contact patient when approved. ? ?Karren Cobble, PharmD, BCACP, [place], CPP ?[address], [address] ?[place], [place], [address] ?Phone: [phone], Fax: [phone]  ? ?

## 2021-05-31 ENCOUNTER — Other Ambulatory Visit: Payer: Self-pay | Admitting: Pharmacist Clinician (PhC)/ Clinical Pharmacy Specialist

## 2021-05-31 LAB — HEMOGLOBIN A1C
Est. average glucose Bld gHb Est-mCnc: 117 mg/dL
Hgb A1c MFr Bld: 5.7 % — ABNORMAL HIGH (ref 4.8–5.6)

## 2021-05-31 MED ORDER — SEMAGLUTIDE-WEIGHT MANAGEMENT 2.4 MG/0.75ML ~~LOC~~ SOAJ: 2.4000 mg | SUBCUTANEOUS | 0 refills | Status: AC

## 2021-05-31 MED ORDER — SEMAGLUTIDE-WEIGHT MANAGEMENT 1 MG/0.5ML ~~LOC~~ SOAJ: 1.0000 mg | SUBCUTANEOUS | 0 refills | Status: AC

## 2021-05-31 MED ORDER — SEMAGLUTIDE-WEIGHT MANAGEMENT 1.7 MG/0.75ML ~~LOC~~ SOAJ: 1.7000 mg | SUBCUTANEOUS | 0 refills | Status: AC

## 2021-05-31 MED ORDER — SEMAGLUTIDE-WEIGHT MANAGEMENT 0.25 MG/0.5ML ~~LOC~~ SOAJ: 0.2500 mg | SUBCUTANEOUS | 0 refills | Status: AC

## 2021-05-31 MED ORDER — SEMAGLUTIDE-WEIGHT MANAGEMENT 0.5 MG/0.5ML ~~LOC~~ SOAJ: 0.5000 mg | SUBCUTANEOUS | 0 refills | Status: AC

## 2021-06-04 ENCOUNTER — Other Ambulatory Visit: Payer: Self-pay | Admitting: Family Medicine

## 2021-06-04 DIAGNOSIS — F411 Generalized anxiety disorder: Secondary | ICD-10-CM

## 2021-06-04 NOTE — Telephone Encounter (Signed)
Dettinger NTBS 30 days given 05/09/21 ?

## 2021-06-05 ENCOUNTER — Ambulatory Visit: Payer: Managed Care, Other (non HMO) | Admitting: Cardiovascular Disease

## 2021-06-05 MED ORDER — BUPROPION HCL ER (XL) 150 MG PO TB24: 150.0000 mg | ORAL_TABLET | Freq: Every day | ORAL | 0 refills | Status: DC

## 2021-06-05 NOTE — Telephone Encounter (Signed)
Pt has appt with Dr. Keturah Barre. On 07/11/2021, please send refill ?

## 2021-06-05 NOTE — Addendum Note (Signed)
Addended by: Antonietta Barcelona D on: 06/05/2021 09:48 AM ? ? Modules accepted: Orders ? ?

## 2021-06-10 ENCOUNTER — Encounter: Payer: Self-pay | Admitting: Pharmacist

## 2021-06-11 ENCOUNTER — Encounter: Payer: Self-pay | Admitting: Pharmacist

## 2021-06-19 ENCOUNTER — Telehealth: Payer: Self-pay | Admitting: Cardiology

## 2021-06-19 NOTE — Telephone Encounter (Signed)
Amber calling to give verbal notice that CTA  of the chest for pt had been denied. Please advise ?

## 2021-06-20 ENCOUNTER — Inpatient Hospital Stay: Admission: RE | Admit: 2021-06-20 | Payer: Managed Care, Other (non HMO) | Source: Ambulatory Visit

## 2021-06-24 NOTE — Telephone Encounter (Signed)
I am not sure why this would not be covered, he meets indication for annual CTA.  Can we look into this?

## 2021-06-25 ENCOUNTER — Other Ambulatory Visit: Payer: Self-pay | Admitting: Family Medicine

## 2021-07-11 ENCOUNTER — Encounter: Payer: Self-pay | Admitting: Family Medicine

## 2021-07-11 ENCOUNTER — Ambulatory Visit: Payer: Managed Care, Other (non HMO) | Admitting: Family Medicine

## 2021-07-11 VITALS — BP 118/72 | HR 81 | Temp 98.3°F | Ht 75.0 in | Wt 333.0 lb

## 2021-07-11 DIAGNOSIS — E782 Mixed hyperlipidemia: Secondary | ICD-10-CM

## 2021-07-11 DIAGNOSIS — I10 Essential (primary) hypertension: Secondary | ICD-10-CM

## 2021-07-11 DIAGNOSIS — F411 Generalized anxiety disorder: Secondary | ICD-10-CM | POA: Diagnosis not present

## 2021-07-11 DIAGNOSIS — M109 Gout, unspecified: Secondary | ICD-10-CM

## 2021-07-11 MED ORDER — BUPROPION HCL ER (XL) 300 MG PO TB24: 300.0000 mg | ORAL_TABLET | Freq: Every day | ORAL | 3 refills | Status: DC

## 2021-07-11 NOTE — Progress Notes (Signed)
BP 118/72   Pulse 81   Temp 98.3 F (36.8 C)   Ht 6' 3" (1.905 m)   Wt (!) 333 lb (151 kg)   SpO2 99%   BMI 41.62 kg/m    Subjective:   Patient ID: Jonathan Barrett, male    DOB: 1965/03/02, 56 y.o.   MRN: [id number]  HPI: Jonathan Barrett is a 56 y.o. male presenting on 07/11/2021 for Medical Management of Chronic Issues, Hypertension, and Anxiety   HPI Hypertension Patient is currently on amlodipine and carvedilol and hydrochlorothiazide and ramipril and spironolactone, and their blood pressure today is 118/72. Patient denies any lightheadedness or dizziness. Patient denies headaches, blurred vision, chest pains, shortness of breath, or weakness. Denies any side effects from medication and is content with current medication.   Hyperlipidemia Patient is coming in for recheck of his hyperlipidemia. The patient is currently taking atorvastatin. They deny any issues with myalgias or history of liver damage from it. They deny any focal numbness or weakness or chest pain.   Anxiety and mood recheck Patient is coming in for anxiety and mood recheck.  He says Wellbutrin is just not quite getting him where he would like to be although it is helping some but he would like to increase it and see if he can get a little more help with it.  He denies any suicidal ideations or thoughts of hurting self.      07/11/2021    3:21 PM 03/22/2019    3:36 PM 11/30/2017    9:00 AM 04/27/2017    9:14 AM 05/26/2016    8:14 AM  Depression screen PHQ 2/9  Decreased Interest 0 0 0 0 0  Down, Depressed, Hopeless 0 0 0 0 0  PHQ - 2 Score 0 0 0 0 0  Altered sleeping 0 0     Tired, decreased energy 0 0     Change in appetite 0 0     Feeling bad or failure about yourself  0 0     Trouble concentrating 0 0     Moving slowly or fidgety/restless 0 0     Suicidal thoughts 0 0     PHQ-9 Score 0 0     Difficult doing work/chores Not difficult at all         Gout Last attack: many years ago Attacks this year:  none Medication: none Location of attacks: foot   Relevant past medical, surgical, family and social history reviewed and updated as indicated. Interim medical history since our last visit reviewed. Allergies and medications reviewed and updated.  Review of Systems  Constitutional:  Negative for chills and fever.  Eyes:  Negative for visual disturbance.  Respiratory:  Negative for shortness of breath and wheezing.   Cardiovascular:  Negative for chest pain and leg swelling.  Musculoskeletal:  Negative for back pain and gait problem.  Skin:  Negative for rash.  Neurological:  Negative for dizziness, weakness and light-headedness.  All other systems reviewed and are negative.   Per HPI unless specifically indicated above   Allergies as of 07/11/2021   No Known Allergies      Medication List        Accurate as of July 11, 2021  3:48 PM. If you have any questions, ask your nurse or doctor.          STOP taking these medications    traMADol 50 MG tablet Commonly known as: Ultram Stopped by: Worthy Rancher, MD  TAKE these medications    amLODipine 10 MG tablet Commonly known as: NORVASC TAKE 1 TABLET BY MOUTH EVERY DAY   aspirin EC 81 MG tablet Take 1 tablet (81 mg total) by mouth daily. Swallow whole.   atorvastatin 80 MG tablet Commonly known as: LIPITOR TAKE 1 TABLET BY MOUTH EVERY DAY   buPROPion 300 MG 24 hr tablet Commonly known as: WELLBUTRIN XL Take 1 tablet (300 mg total) by mouth daily. What changed:  medication strength how much to take Changed by: Fransisca Kaufmann Sidhant Helderman, MD   carvedilol 12.5 MG tablet Commonly known as: COREG TAKE 1 TABLET BY MOUTH 2 TIMES DAILY.   docusate sodium 100 MG capsule Commonly known as: COLACE Take 1 capsule (100 mg total) by mouth 2 (two) times daily.   hydrochlorothiazide 25 MG tablet Commonly known as: HYDRODIURIL TAKE 1 TABLET (25 MG TOTAL) BY MOUTH DAILY.   nitroGLYCERIN 0.4 MG SL tablet Commonly  known as: NITROSTAT Place 0.4 mg under the tongue every 5 (five) minutes as needed for chest pain.   ramipril 10 MG capsule Commonly known as: ALTACE TAKE 1 CAPSULE BY MOUTH 2 TIMES DAILY.   Semaglutide-Weight Management 0.5 MG/0.5ML Soaj Inject 0.5 mg into the skin once a week for 28 days.   Semaglutide-Weight Management 1 MG/0.5ML Soaj Inject 1 mg into the skin once a week for 28 days. Start taking on: July 28, 2021   Semaglutide-Weight Management 1.7 MG/0.75ML Soaj Inject 1.7 mg into the skin once a week for 28 days. Start taking on: August 26, 2021   Semaglutide-Weight Management 2.4 MG/0.75ML Soaj Inject 2.4 mg into the skin once a week for 28 days. Start taking on: September 24, 2021   spironolactone 25 MG tablet Commonly known as: ALDACTONE TAKE 1 TABLET (25 MG TOTAL) BY MOUTH DAILY.         Objective:   BP 118/72   Pulse 81   Temp 98.3 F (36.8 C)   Ht 6' 3" (1.905 m)   Wt (!) 333 lb (151 kg)   SpO2 99%   BMI 41.62 kg/m   Wt Readings from Last 3 Encounters:  07/11/21 (!) 333 lb (151 kg)  05/30/21 (!) 338 lb (153.3 kg)  05/23/21 (!) 334 lb (151.5 kg)    Physical Exam Vitals and nursing note reviewed.  Constitutional:      General: He is not in acute distress.    Appearance: He is well-developed. He is not diaphoretic.  Eyes:     General: No scleral icterus.    Conjunctiva/sclera: Conjunctivae normal.  Neck:     Thyroid: No thyromegaly.  Cardiovascular:     Rate and Rhythm: Normal rate and regular rhythm.     Heart sounds: Normal heart sounds. No murmur heard. Pulmonary:     Effort: Pulmonary effort is normal. No respiratory distress.     Breath sounds: Normal breath sounds. No wheezing.  Musculoskeletal:        General: No swelling.     Cervical back: Neck supple.  Lymphadenopathy:     Cervical: No cervical adenopathy.  Skin:    General: Skin is warm and dry.     Findings: No rash.  Neurological:     Mental Status: He is alert and oriented to  person, place, and time.     Coordination: Coordination normal.  Psychiatric:        Behavior: Behavior normal.       Assessment & Plan:   Problem List Items Addressed This  Visit       Cardiovascular and Mediastinum   Hypertension - Primary   Relevant Orders   CBC with Differential/Platelet   CMP14+EGFR     Other   Hyperlipidemia   Relevant Orders   Lipid panel   Gout   Other Visit Diagnoses     GAD (generalized anxiety disorder)       Relevant Medications   buPROPion (WELLBUTRIN XL) 300 MG 24 hr tablet       increase Wellbutrin to 300 mg to help more with anxiety. Follow up plan: Return in about 6 months (around 01/10/2022), or if symptoms worsen or fail to improve, for Hypertension and cholesterol and gout and anxiety recheck..  Counseling provided for all of the vaccine components Orders Placed This Encounter  Procedures   CBC with Differential/Platelet   CMP14+EGFR   Lipid panel    Joshua Dettinger, MD [facility] 07/11/2021, 3:48 PM     

## 2021-07-12 LAB — CMP14+EGFR
ALT: 38 IU/L (ref 0–44)
AST: 24 IU/L (ref 0–40)
Albumin/Globulin Ratio: 1.3 (ref 1.2–2.2)
Albumin: 4.4 g/dL (ref 3.8–4.9)
Alkaline Phosphatase: 82 IU/L (ref 44–121)
BUN/Creatinine Ratio: 14 (ref 9–20)
BUN: 17 mg/dL (ref 6–24)
Bilirubin Total: 0.3 mg/dL (ref 0.0–1.2)
CO2: 21 mmol/L (ref 20–29)
Calcium: 9.6 mg/dL (ref 8.7–10.2)
Chloride: 102 mmol/L (ref 96–106)
Creatinine, Ser: 1.21 mg/dL (ref 0.76–1.27)
Globulin, Total: 3.4 g/dL (ref 1.5–4.5)
Glucose: 106 mg/dL — ABNORMAL HIGH (ref 70–99)
Potassium: 4.3 mmol/L (ref 3.5–5.2)
Sodium: 139 mmol/L (ref 134–144)
Total Protein: 7.8 g/dL (ref 6.0–8.5)
eGFR: 70 mL/min/{1.73_m2} (ref >59)

## 2021-07-12 LAB — CBC WITH DIFFERENTIAL/PLATELET
Basophils Absolute: 0.1 10*3/uL (ref 0.0–0.2)
Basos: 1 %
EOS (ABSOLUTE): 0.2 10*3/uL (ref 0.0–0.4)
Eos: 2 %
Hematocrit: 43.1 % (ref 37.5–51.0)
Hemoglobin: 14.4 g/dL (ref 13.0–17.7)
Immature Grans (Abs): 0.1 10*3/uL (ref 0.0–0.1)
Immature Granulocytes: 1 %
Lymphocytes Absolute: 2.4 10*3/uL (ref 0.7–3.1)
Lymphs: 21 %
MCH: 30.1 pg (ref 26.6–33.0)
MCHC: 33.4 g/dL (ref 31.5–35.7)
MCV: 90 fL (ref 79–97)
Monocytes Absolute: 1.3 10*3/uL — ABNORMAL HIGH (ref 0.1–0.9)
Monocytes: 11 %
Neutrophils Absolute: 7.5 10*3/uL — ABNORMAL HIGH (ref 1.4–7.0)
Neutrophils: 64 %
Platelets: 257 10*3/uL (ref 150–450)
RBC: 4.78 x10E6/uL (ref 4.14–5.80)
RDW: 12.6 % (ref 11.6–15.4)
WBC: 11.5 10*3/uL — ABNORMAL HIGH (ref 3.4–10.8)

## 2021-07-12 LAB — LIPID PANEL
Chol/HDL Ratio: 3.2 ratio (ref 0.0–5.0)
Cholesterol, Total: 125 mg/dL (ref 100–199)
HDL: 39 mg/dL — ABNORMAL LOW (ref >39)
LDL Chol Calc (NIH): 62 mg/dL (ref 0–99)
Triglycerides: 136 mg/dL (ref 0–149)
VLDL Cholesterol Cal: 24 mg/dL (ref 5–40)

## 2021-09-04 ENCOUNTER — Other Ambulatory Visit: Payer: Self-pay | Admitting: Family Medicine

## 2021-09-04 DIAGNOSIS — F411 Generalized anxiety disorder: Secondary | ICD-10-CM

## 2021-09-23 ENCOUNTER — Ambulatory Visit: Payer: Managed Care, Other (non HMO) | Admitting: Family Medicine

## 2021-09-23 ENCOUNTER — Other Ambulatory Visit: Payer: Self-pay | Admitting: Cardiology

## 2021-09-23 ENCOUNTER — Encounter: Payer: Self-pay | Admitting: Family Medicine

## 2021-09-23 ENCOUNTER — Other Ambulatory Visit: Payer: Self-pay | Admitting: Family Medicine

## 2021-09-23 VITALS — BP 133/83 | HR 94 | Ht 75.0 in | Wt 328.0 lb

## 2021-09-23 DIAGNOSIS — S91111A Laceration without foreign body of right great toe without damage to nail, initial encounter: Secondary | ICD-10-CM | POA: Diagnosis not present

## 2021-09-23 MED ORDER — CEPHALEXIN 500 MG PO CAPS: 500.0000 mg | ORAL_CAPSULE | Freq: Four times a day (QID) | ORAL | 0 refills | Status: DC

## 2021-09-23 NOTE — Progress Notes (Signed)
BP 133/83   Pulse 94   Ht '6\' 3"'$  (1.905 m)   Wt (!) 328 lb (148.8 kg)   SpO2 94%   BMI 41.00 kg/m    Subjective:   Patient ID: Jonathan Barrett, male    DOB: July 26, 1965, 56 y.o.   MRN: [id number]  HPI: Jonathan Barrett is a 56 y.o. male presenting on 09/23/2021 for Laceration   HPI Patient is coming in for laceration of his toe.  He says he dropped his lawn mower deck on his right great toe.  This occurred 2 days ago.  They did put peroxide on it and antibiotic ointment and wrapped it and the bleeding has calmed down but not stopped completely.  He does have pain in the toe movement.  He has sensation in the end of the toe as well.  The laceration that he has just behind the toenail or just proximal to the toenail.  Relevant past medical, surgical, family and social history reviewed and updated as indicated. Interim medical history since our last visit reviewed. Allergies and medications reviewed and updated.  Review of Systems  Skin:  Positive for color change and wound. Negative for rash.    Per HPI unless specifically indicated above   Allergies as of 09/23/2021   No Known Allergies      Medication List        Accurate as of September 23, 2021 12:44 PM. If you have any questions, ask your nurse or doctor.          amLODipine 10 MG tablet Commonly known as: NORVASC TAKE 1 TABLET BY MOUTH EVERY DAY   aspirin EC 81 MG tablet Take 1 tablet (81 mg total) by mouth daily. Swallow whole.   atorvastatin 80 MG tablet Commonly known as: LIPITOR TAKE 1 TABLET BY MOUTH EVERY DAY   buPROPion 300 MG 24 hr tablet Commonly known as: WELLBUTRIN XL Take 1 tablet (300 mg total) by mouth daily.   carvedilol 12.5 MG tablet Commonly known as: COREG TAKE 1 TABLET BY MOUTH 2 TIMES DAILY.   cephALEXin 500 MG capsule Commonly known as: KEFLEX Take 1 capsule (500 mg total) by mouth 4 (four) times daily. Started by: Fransisca Kaufmann Marilynn Ekstein, MD   docusate sodium 100 MG capsule Commonly  known as: COLACE Take 1 capsule (100 mg total) by mouth 2 (two) times daily.   hydrochlorothiazide 25 MG tablet Commonly known as: HYDRODIURIL TAKE 1 TABLET (25 MG TOTAL) BY MOUTH DAILY.   nitroGLYCERIN 0.4 MG SL tablet Commonly known as: NITROSTAT Place 0.4 mg under the tongue every 5 (five) minutes as needed for chest pain.   ramipril 10 MG capsule Commonly known as: ALTACE TAKE 1 CAPSULE BY MOUTH 2 TIMES DAILY.   Semaglutide-Weight Management 1.7 MG/0.75ML Soaj Inject 1.7 mg into the skin once a week for 28 days.   Semaglutide-Weight Management 2.4 MG/0.75ML Soaj Inject 2.4 mg into the skin once a week for 28 days. Start taking on: September 24, 2021   spironolactone 25 MG tablet Commonly known as: ALDACTONE TAKE 1 TABLET (25 MG TOTAL) BY MOUTH DAILY.         Objective:   BP 133/83   Pulse 94   Ht '6\' 3"'$  (1.905 m)   Wt (!) 328 lb (148.8 kg)   SpO2 94%   BMI 41.00 kg/m   Wt Readings from Last 3 Encounters:  09/23/21 (!) 328 lb (148.8 kg)  07/11/21 (!) 333 lb (151 kg)  05/30/21 (!) 338 lb (  153.3 kg)    Physical Exam Vitals and nursing note reviewed.  Skin:    Findings: Wound (Superficial laceration just proximal to his right great toenail, slight oozing and bleeding.  No purulence or erythema.  Swelling and bruising goes under the nailbed and has raised up the nail on that toe as well.) present.      Recommended daily dressing changes including antibiotic nonstick and Coban. Assessment & Plan:   Problem List Items Addressed This Visit   None Visit Diagnoses     Laceration of right great toe without foreign body present or damage to nail, initial encounter    -  Primary   Relevant Medications   cephALEXin (KEFLEX) 500 MG capsule       Laceration actually appears superficial so no suturing needed, recommend daily dressing changes and watch for any signs of infection.  We will give preventative antibiotic  Patient has tetanus vaccine 4 years ago so he is  up-to-date. Follow up plan: Return if symptoms worsen or fail to improve.  Counseling provided for all of the vaccine components No orders of the defined types were placed in this encounter.   Caryl Pina, MD [facility] 09/23/2021, 12:44 PM

## 2021-11-05 ENCOUNTER — Ambulatory Visit: Payer: Managed Care, Other (non HMO) | Admitting: Cardiovascular Disease

## 2021-11-06 NOTE — Progress Notes (Unsigned)
Cardiology Office Note:    Date:  11/07/2021   ID:  Jonathan Barrett, DOB Jan 28, 1966, MRN [id number]  PCP:  Dettinger, Fransisca Kaufmann, MD  Cardiologist:  Donato Heinz, MD  Electrophysiologist:  None   Referring MD: Dettinger, Fransisca Kaufmann, MD   Chief Complaint  Patient presents with   Follow-up   Coronary Artery Disease   History of Present Illness:    Jonathan Barrett is a 56 y.o. male with a hx of CAD status post LCx PCI 02/13/2020, renal cell carcinoma, hypertension, hyperlipidemia who presents for follow-up.  He was initially seen on 02/01/2020.  Reported symptoms consistent with typical angina.  Cardiac catheterization on 02/13/2020 showed severe two-vessel obstructive CAD (85% proximal LCx stenosis, CTO of mid RCA with left-to-right collaterals, 65% ramus, 35% proximal to mid LAD, aneurysmal dilatation of distal left main), normal LV function, mild elevation in LVEDP 20 mmHg.  Treated with DES to proximal LCx.  Echocardiogram on 03/17/2019 showed normal biventricular function, grade 1 diastolic dysfunction, mild dilatation of aortic root measuring 42 mm.  CTA chest on 03/26/2020 showed mild aortic root dilatation measuring 44 mm, 40 mm in ascending aorta.  Also noted to have 4.1 x 3.5 cm mass in the right kidney consistent with renal cell carcinoma.  He was referred to urology and underwent partial nephrectomy on 06/04/2020 with Dr. Alinda Money.  Since last clinic visit, he reports that he has been doing well.  Denies any chest pain, dyspnea, lightheadedness, syncope, lower extremity edema, or palpitations.  Reports has had issues with CPAP during summer due to allergies but has been compliant recently.  He has been walking 30 to 45 minutes at lunch every day, denies any exertional symptoms.  Wt Readings from Last 3 Encounters:  11/07/21 (!) 332 lb 9.6 oz (150.9 kg)  09/23/21 (!) 328 lb (148.8 kg)  07/11/21 (!) 333 lb (151 kg)     Past Medical History:  Diagnosis Date   Coronary artery disease     DES   Hyperlipidemia    Hypertension     Past Surgical History:  Procedure Laterality Date   CORONARY STENT INTERVENTION N/A 02/13/2020   Procedure: CORONARY STENT INTERVENTION;  Surgeon: Martinique, Peter M, MD;  Location: [facility];  Service: Cardiovascular;  Laterality: N/A;   CYST EXCISION     LEFT HEART CATH AND CORONARY ANGIOGRAPHY N/A 02/13/2020   Procedure: LEFT HEART CATH AND CORONARY ANGIOGRAPHY;  Surgeon: Martinique, Peter M, MD;  Location: [facility];  Service: Cardiovascular;  Laterality: N/A;   MIDDLE EAR SURGERY     ROBOTIC ASSITED PARTIAL NEPHRECTOMY Right 06/04/2020   Procedure: XI ROBOTIC ASSITED PARTIAL NEPHRECTOMY;  Surgeon: Raynelle Bring, MD;  Location: [facility];  Service: Urology;  Laterality: Right;    Current Medications: Current Meds  Medication Sig   amLODipine (NORVASC) 10 MG tablet TAKE 1 TABLET BY MOUTH EVERY DAY   aspirin EC 81 MG tablet Take 1 tablet (81 mg total) by mouth daily. Swallow whole.   atorvastatin (LIPITOR) 80 MG tablet TAKE 1 TABLET BY MOUTH EVERY DAY   buPROPion (WELLBUTRIN XL) 300 MG 24 hr tablet Take 1 tablet (300 mg total) by mouth daily.   carvedilol (COREG) 12.5 MG tablet TAKE 1 TABLET BY MOUTH 2 TIMES DAILY.   cephALEXin (KEFLEX) 500 MG capsule Take 1 capsule (500 mg total) by mouth 4 (four) times daily.   docusate sodium (COLACE) 100 MG capsule Take 1 capsule (100 mg total) by mouth 2 (two) times  daily.   hydrochlorothiazide (HYDRODIURIL) 25 MG tablet TAKE 1 TABLET (25 MG TOTAL) BY MOUTH DAILY.   nitroGLYCERIN (NITROSTAT) 0.4 MG SL tablet Place 0.4 mg under the tongue every 5 (five) minutes as needed for chest pain.   ramipril (ALTACE) 10 MG capsule TAKE 1 CAPSULE BY MOUTH 2 TIMES DAILY.   spironolactone (ALDACTONE) 25 MG tablet TAKE 1 TABLET (25 MG TOTAL) BY MOUTH DAILY.     Allergies:   Patient has no known allergies.   Social History   Socioeconomic History   Marital status: Married    Spouse name: Not on file    Number of children: Not on file   Years of education: Not on file   Highest education level: Not on file  Occupational History   Not on file  Tobacco Use   Smoking status: Never   Smokeless tobacco: Never  Vaping Use   Vaping Use: Never used  Substance and Sexual Activity   Alcohol use: Yes    Alcohol/week: 12.0 standard drinks of alcohol    Types: 12 Cans of beer per week    Comment: occas.   Drug use: No   Sexual activity: Yes  Other Topics Concern   Not on file  Social History Narrative   Not on file   Social Determinants of Health   Financial Resource Strain: Not on file  Food Insecurity: Not on file  Transportation Needs: Not on file  Physical Activity: Not on file  Stress: Not on file  Social Connections: Not on file     Family History: The patient's family history includes Heart disease in his father.  ROS:   Please see the history of present illness.     All other systems reviewed and are negative.  EKGs/Labs/Other Studies Reviewed:    The following studies were reviewed today:   EKG:   11/07/21: Normal sinus rhythm, rate 79, no ST abnormality 05/23/2021: Normal sinus rhythm, rate 90, no ST abnormalities  Recent Labs: 05/23/2021: Magnesium 2.0 07/11/2021: ALT 38; BUN 17; Creatinine, Ser 1.21; Hemoglobin 14.4; Platelets 257; Potassium 4.3; Sodium 139  Recent Lipid Panel    Component Value Date/Time   CHOL 125 07/11/2021 1555   CHOL 212 (H) 06/22/2012 0946   TRIG 136 07/11/2021 1555   TRIG 174 (H) 01/31/2013 1216   TRIG 136 06/22/2012 0946   HDL 39 (L) 07/11/2021 1555   HDL 51 01/31/2013 1216   HDL 49 06/22/2012 0946   CHOLHDL 3.2 07/11/2021 1555   LDLCALC 62 07/11/2021 1555   LDLCALC 107 (H) 01/31/2013 1216   LDLCALC 136 (H) 06/22/2012 0946    Physical Exam:    VS:  BP 134/68 (BP Location: Left Arm, Patient Position: Sitting, Cuff Size: Large)   Pulse 79   Ht '6\' 3"'$  (1.905 m)   Wt (!) 332 lb 9.6 oz (150.9 kg)   SpO2 97%   BMI 41.57 kg/m      Wt Readings from Last 3 Encounters:  11/07/21 (!) 332 lb 9.6 oz (150.9 kg)  09/23/21 (!) 328 lb (148.8 kg)  07/11/21 (!) 333 lb (151 kg)     GEN:  in no acute distress HEENT: Normal NECK: No JVD; No carotid bruits CARDIAC: RRR, no murmurs, rubs, gallops RESPIRATORY:  Clear to auscultation without rales, wheezing or rhonchi  ABDOMEN: Soft, non-tender, non-distended MUSCULOSKELETAL:  No edema; No deformity  SKIN: Warm and dry NEUROLOGIC:  Alert and oriented x 3 PSYCHIATRIC:  Normal affect   ASSESSMENT:  1. CAD in native artery   2. Aortic dilatation (HCC)   3. Essential hypertension   4. Hyperlipidemia, unspecified hyperlipidemia type   5. OSA (obstructive sleep apnea)   6. Morbid obesity ([place])       PLAN:     CAD: Reported symptoms consistent with typical angina.  Cardiac catheterization on 02/13/2020 showed severe two-vessel obstructive CAD (85% proximal LCx stenosis, CTO of mid RCA with left-to-right collaterals, 65% ramus, 35% proximal to mid LAD, aneurysmal dilatation of distal left main), normal LV function, mild elevation in LVEDP 20 mmHg.  Treated with DES to proximal LCx.  Echocardiogram 03/16/2020 shows normal biventricular function.  Reports chest pain resolved since his catheterization. -Continue aspirin 81 mg daily indefinitely.  Completed 1 year of DAPT, discontinued ticagrelor -Continue atorvastatin 80 mg daily -Continue carvedilol 12.5 mg twice daily  Hypertension: On amlodipine 10 mg daily, hydrochlorothiazide 25 mg daily, carvedilol 12.5 mg twice daily, ramipril 10 mg twice daily, spironolactone 25 mg daily.  Suspect untreated OSA contributing to resistant hypertension.  BP is improved since starting CPAP.  Currently appears controlled.    Hyperlipidemia: LDL 107 on 02/01/2020.  Switched from simvastatin 40 mg daily to atorvastatin 80 mg daily for goal LDL less than 70.  LDL 62 on 07/11/21  OSA: Sleep study on 10/29/2020 showed severe OSA, on CPAP.   Encouraged compliance.  Aortic dilatation: CTA chest on 03/26/2020 showed aortic root dilatation measuring 44 mm, 40 mm in ascending aorta. Had CT chest 06/2021 (images unavailable but per report no aneurysmal dilatation).  Plan echo next year to follow aortic dilatation  Renal cell carcinoma: noted on CT chest 03/26/20 to have 4.1 x 3.5 cm mass in the right kidney consistent with renal cell carcinoma.  He was referred to urology and underwent partial nephrectomy on 06/04/2020 with Dr. Alinda Money.  Morbid obesity: Body mass index is 41.57 kg/m.  Referred to pharmacy clinic to evaluate for [facility], but was too expensive.  Encouraged diet/exercise   RTC in 6 months   Medication Adjustments/Labs and Tests Ordered: Current medicines are reviewed at length with the patient today.  Concerns regarding medicines are outlined above.  Orders Placed This Encounter  Procedures   EKG 12-Lead   ECHOCARDIOGRAM COMPLETE    No orders of the defined types were placed in this encounter.    Patient Instructions  Medication Instructions:  The current medical regimen is effective;  continue present plan and medications.  *If you need a refill on your cardiac medications before your next appointment, please call your pharmacy*  Testing/Procedures: Echocardiogram (6 month) - Your physician has requested that you have an echocardiogram. Echocardiography is a painless test that uses sound waves to create images of your heart. It provides your doctor with information about the size and shape of your heart and how well your heart's chambers and valves are working. This procedure takes approximately one hour. There are no restrictions for this procedure.    Follow-Up: At [facility], you and your health needs are our priority.  As part of our continuing mission to provide you with exceptional heart care, we have created designated Provider Care Teams.  These Care Teams include your primary Cardiologist  (physician) and Advanced Practice Providers (APPs -  Physician Assistants and Nurse Practitioners) who all work together to provide you with the care you need, when you need it.  We recommend signing up for the patient portal called "MyChart".  Sign up information is provided on this After Visit  Summary.  MyChart is used to connect with patients for Virtual Visits (Telemedicine).  Patients are able to view lab/test results, encounter notes, upcoming appointments, etc.  Non-urgent messages can be sent to your provider as well.   To learn more about what you can do with MyChart, go to [email or web address].    Your next appointment:   6 month(s)  The format for your next appointment:   In Person  Provider:   Donato Heinz, MD            Signed, Donato Heinz, MD  11/07/2021 1:48 PM    [place]

## 2021-11-07 ENCOUNTER — Encounter: Payer: Self-pay | Admitting: Cardiology

## 2021-11-07 ENCOUNTER — Ambulatory Visit: Payer: Managed Care, Other (non HMO) | Attending: Cardiology | Admitting: Cardiology

## 2021-11-07 VITALS — BP 134/68 | HR 79 | Ht 75.0 in | Wt 332.6 lb

## 2021-11-07 DIAGNOSIS — I77819 Aortic ectasia, unspecified site: Secondary | ICD-10-CM | POA: Diagnosis not present

## 2021-11-07 DIAGNOSIS — I2510 Atherosclerotic heart disease of native coronary artery without angina pectoris: Secondary | ICD-10-CM

## 2021-11-07 DIAGNOSIS — E785 Hyperlipidemia, unspecified: Secondary | ICD-10-CM

## 2021-11-07 DIAGNOSIS — I10 Essential (primary) hypertension: Secondary | ICD-10-CM | POA: Diagnosis not present

## 2021-11-07 DIAGNOSIS — G4733 Obstructive sleep apnea (adult) (pediatric): Secondary | ICD-10-CM

## 2021-11-07 NOTE — Patient Instructions (Signed)
Medication Instructions:  The current medical regimen is effective;  continue present plan and medications.  *If you need a refill on your cardiac medications before your next appointment, please call your pharmacy*  Testing/Procedures: Echocardiogram (6 month) - Your physician has requested that you have an echocardiogram. Echocardiography is a painless test that uses sound waves to create images of your heart. It provides your doctor with information about the size and shape of your heart and how well your heart's chambers and valves are working. This procedure takes approximately one hour. There are no restrictions for this procedure.    Follow-Up: At [facility], you and your health needs are our priority.  As part of our continuing mission to provide you with exceptional heart care, we have created designated Provider Care Teams.  These Care Teams include your primary Cardiologist (physician) and Advanced Practice Providers (APPs -  Physician Assistants and Nurse Practitioners) who all work together to provide you with the care you need, when you need it.  We recommend signing up for the patient portal called "MyChart".  Sign up information is provided on this After Visit Summary.  MyChart is used to connect with patients for Virtual Visits (Telemedicine).  Patients are able to view lab/test results, encounter notes, upcoming appointments, etc.  Non-urgent messages can be sent to your provider as well.   To learn more about what you can do with MyChart, go to [email or web address].    Your next appointment:   6 month(s)  The format for your next appointment:   In Person  Provider:   Donato Heinz, MD

## 2021-12-14 ENCOUNTER — Other Ambulatory Visit: Payer: Self-pay | Admitting: Family Medicine

## 2021-12-14 DIAGNOSIS — F411 Generalized anxiety disorder: Secondary | ICD-10-CM

## 2021-12-21 IMAGING — CR DG CHEST 2V
2 series · 2 of 2 positions shown · non-contrast
Comparison: 01/07/2007

CLINICAL DATA: 55-year-old male with a history of renal cell
carcinoma

EXAM:
CHEST - 2 VIEW

[w chest pa]
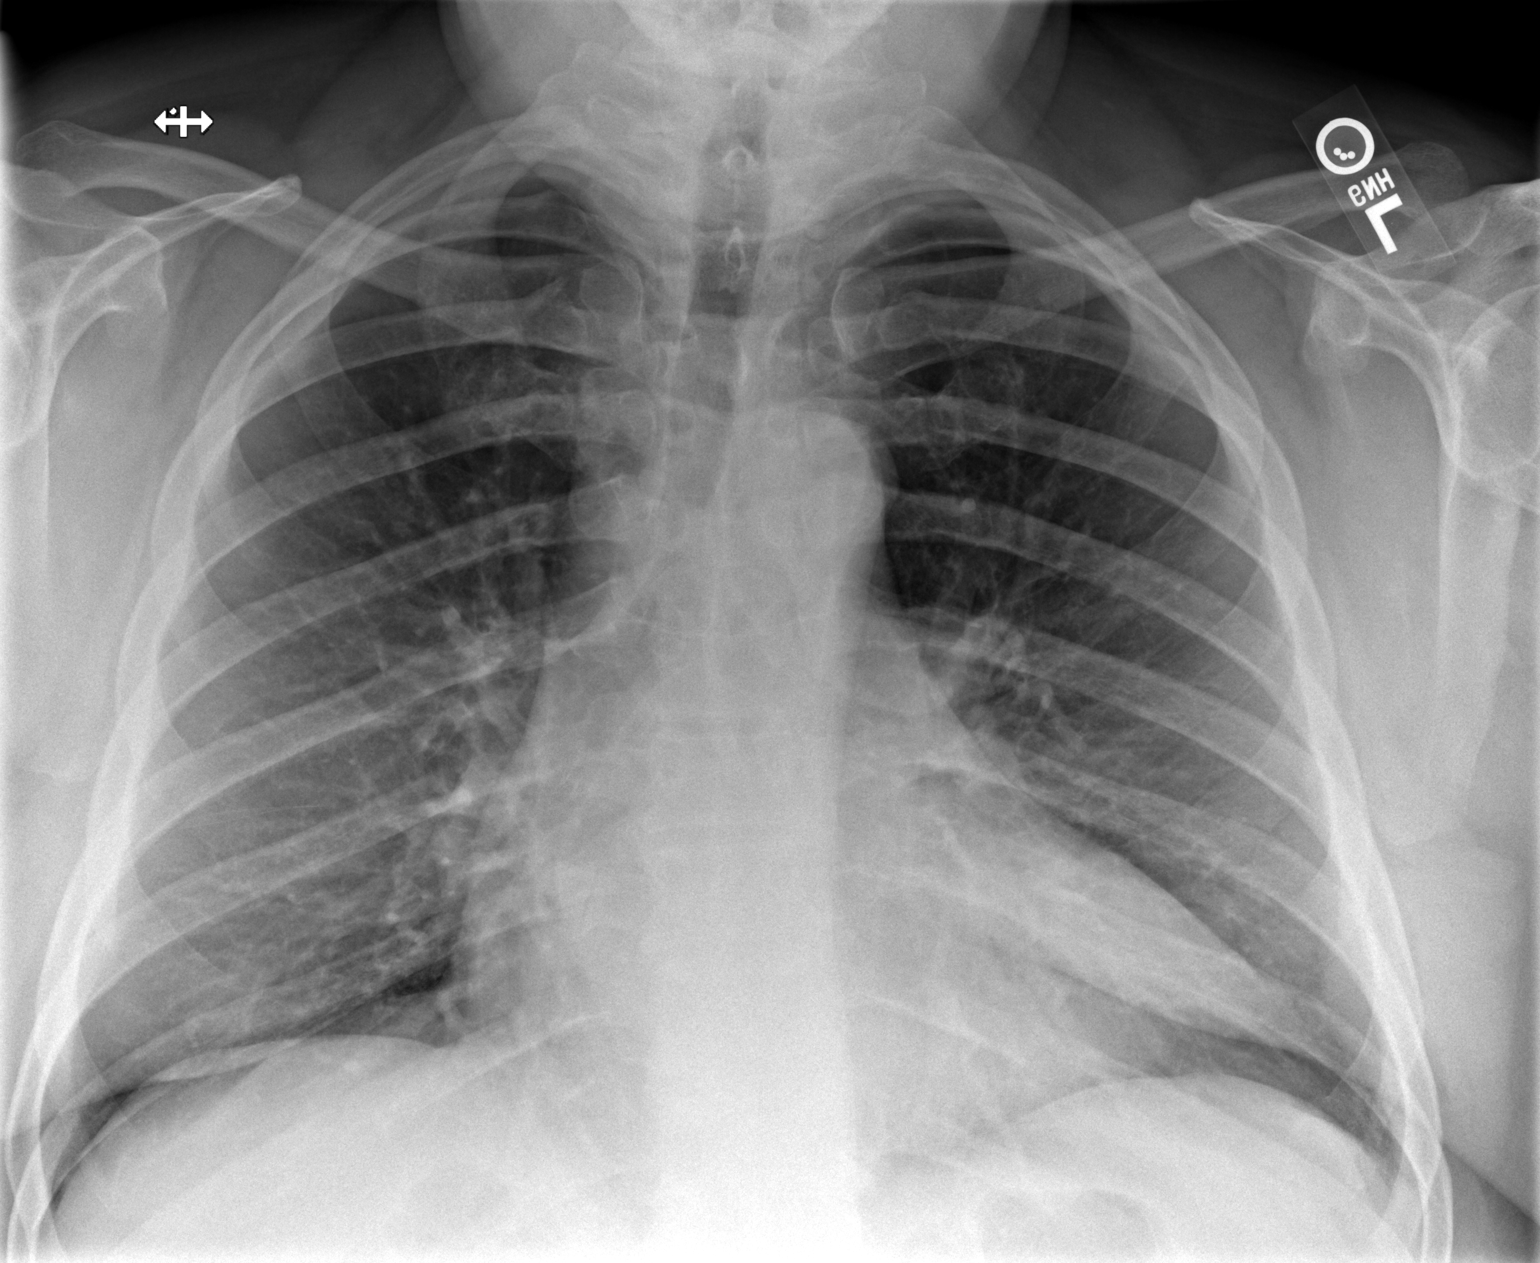

[w chest lat]
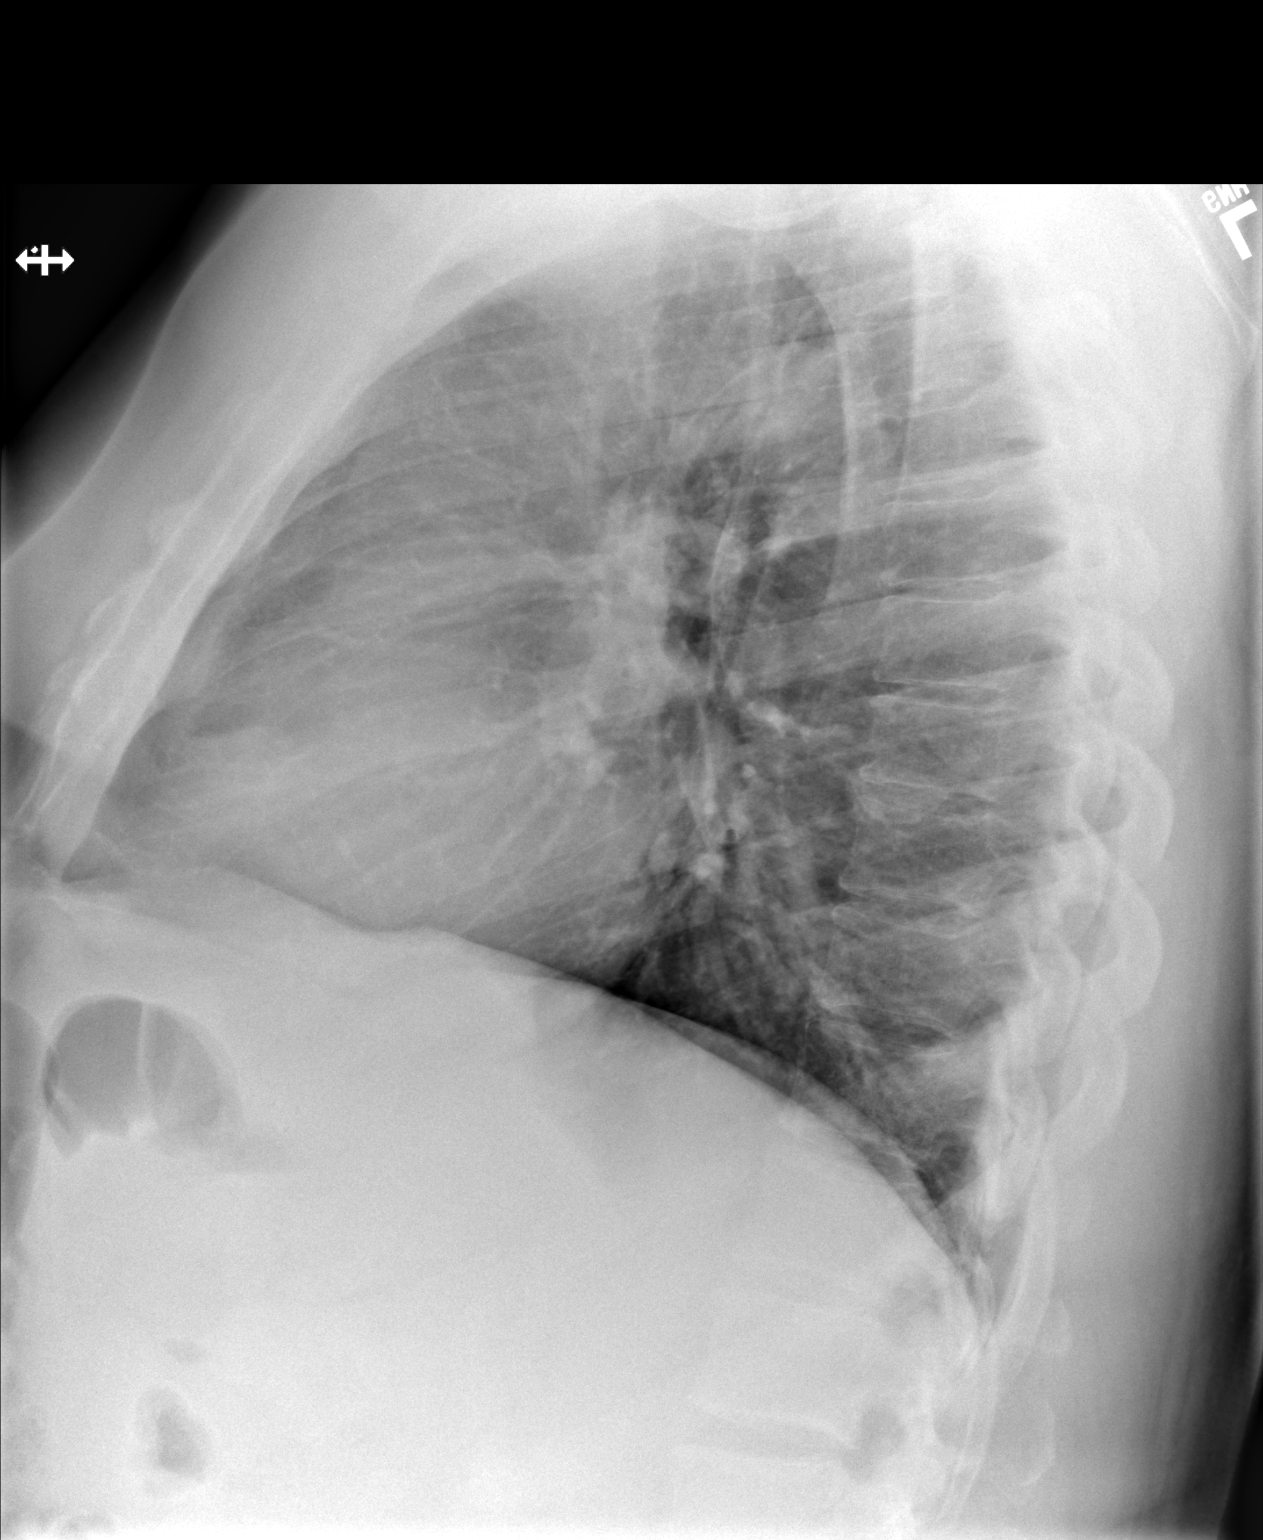

[2 of 2 positions shown; findings below may reference images not displayed]

FINDINGS: Cardiomediastinal silhouette unchanged in size and contour. No
evidence of central vascular congestion. No interlobular septal
thickening.

Low lung volumes with crowding the interstitium.

No pneumothorax or pleural effusion. Coarsened interstitial
markings, with no confluent airspace disease.

No acute displaced fracture. Degenerative changes of the spine.
IMPRESSION: No active cardiopulmonary disease.

## 2022-01-10 ENCOUNTER — Ambulatory Visit: Payer: Managed Care, Other (non HMO) | Admitting: Family Medicine

## 2022-01-13 ENCOUNTER — Encounter: Payer: Self-pay | Admitting: Family Medicine

## 2022-03-11 ENCOUNTER — Ambulatory Visit: Payer: Managed Care, Other (non HMO) | Admitting: Cardiovascular Disease

## 2022-03-25 ENCOUNTER — Other Ambulatory Visit: Payer: Self-pay | Admitting: Family Medicine

## 2022-04-03 ENCOUNTER — Other Ambulatory Visit: Payer: Self-pay | Admitting: Family Medicine

## 2022-04-03 ENCOUNTER — Other Ambulatory Visit: Payer: Self-pay | Admitting: Cardiology

## 2022-04-21 ENCOUNTER — Encounter: Payer: Self-pay | Admitting: Family Medicine

## 2022-04-21 ENCOUNTER — Other Ambulatory Visit: Payer: Self-pay | Admitting: Family Medicine

## 2022-04-21 NOTE — Telephone Encounter (Signed)
Letter sent.

## 2022-04-26 ENCOUNTER — Other Ambulatory Visit: Payer: Self-pay | Admitting: Family Medicine

## 2022-04-28 ENCOUNTER — Encounter: Payer: Self-pay | Admitting: Family Medicine

## 2022-04-28 NOTE — Telephone Encounter (Signed)
Dettinger NTBS 30 days given 04/03/22

## 2022-04-28 NOTE — Telephone Encounter (Signed)
LMTCB to schedule appt Letter mailed 

## 2022-04-30 ENCOUNTER — Other Ambulatory Visit: Payer: Self-pay | Admitting: Family Medicine

## 2022-05-09 ENCOUNTER — Ambulatory Visit (HOSPITAL_COMMUNITY): Payer: BLUE CROSS/BLUE SHIELD | Attending: Cardiology

## 2022-05-09 DIAGNOSIS — I2510 Atherosclerotic heart disease of native coronary artery without angina pectoris: Secondary | ICD-10-CM | POA: Diagnosis not present

## 2022-05-09 DIAGNOSIS — I77819 Aortic ectasia, unspecified site: Secondary | ICD-10-CM | POA: Diagnosis not present

## 2022-05-09 MED ORDER — PERFLUTREN LIPID MICROSPHERE
1.0000 mL | INTRAVENOUS | Status: AC | PRN
  Administered 2022-05-09: 2 mL via INTRAVENOUS
  Administered 2022-05-09: 4 mL via INTRAVENOUS

## 2022-05-12 LAB — ECHOCARDIOGRAM COMPLETE
Area-P 1/2: 3.03 cm2
S' Lateral: 2.6 cm

## 2022-05-19 ENCOUNTER — Other Ambulatory Visit: Payer: Self-pay | Admitting: Family Medicine

## 2022-05-19 NOTE — Telephone Encounter (Signed)
Dettinger pt NTBS 30-d given 04/21/22

## 2022-05-20 NOTE — Telephone Encounter (Signed)
Left message for patient to call back to schedule med refill appt.  

## 2022-05-23 ENCOUNTER — Other Ambulatory Visit: Payer: Self-pay | Admitting: Family Medicine

## 2022-06-29 ENCOUNTER — Other Ambulatory Visit: Payer: Self-pay | Admitting: Family Medicine

## 2022-06-29 DIAGNOSIS — F411 Generalized anxiety disorder: Secondary | ICD-10-CM

## 2022-07-25 DIAGNOSIS — D49511 Neoplasm of unspecified behavior of right kidney: Secondary | ICD-10-CM | POA: Diagnosis not present

## 2022-07-29 DIAGNOSIS — D49511 Neoplasm of unspecified behavior of right kidney: Secondary | ICD-10-CM | POA: Diagnosis not present

## 2022-08-01 DIAGNOSIS — D49511 Neoplasm of unspecified behavior of right kidney: Secondary | ICD-10-CM | POA: Diagnosis not present

## 2022-09-17 ENCOUNTER — Other Ambulatory Visit: Payer: Self-pay | Admitting: Family Medicine

## 2022-09-19 MED ORDER — HYDROCHLOROTHIAZIDE 25 MG PO TABS: 25.0000 mg | ORAL_TABLET | Freq: Every day | ORAL | 1 refills | Status: DC

## 2022-09-19 MED ORDER — CARVEDILOL 12.5 MG PO TABS: 12.5000 mg | ORAL_TABLET | Freq: Two times a day (BID) | ORAL | 1 refills | Status: DC

## 2022-09-19 NOTE — Telephone Encounter (Signed)
  Prescription Request  09/19/2022  Is this a "Controlled Substance" medicine?   Have you seen your PCP in the last 2 weeks? Made appt for 10/4   If YES, route message to pool  -  If NO, patient needs to be scheduled for appointment.  What is the name of the medication or equipment? hydrochlorothiazide (HYDRODIURIL) 25 MG tablet and carvedilol (COREG) 12.5 MG tablet,   Have you contacted your pharmacy to request a refill? Yes    Which pharmacy would you like this sent to? [facility]    Patient notified that their request is being sent to the clinical staff for review and that they should receive a response within 2 business days.

## 2022-09-22 NOTE — Telephone Encounter (Signed)
LMOVM refills sent to pharmacy °

## 2022-10-08 ENCOUNTER — Telehealth: Payer: Self-pay | Admitting: Family Medicine

## 2022-10-08 DIAGNOSIS — F411 Generalized anxiety disorder: Secondary | ICD-10-CM

## 2022-10-08 NOTE — Telephone Encounter (Signed)
  Prescription Request  10/08/2022  What is the name of the medication or equipment? Needs refills on some of his meds to last him until his appt in October.  Have you contacted your pharmacy to request a refill? Yes  Which pharmacy would you like this sent to? [facility]   Patient notified that their request is being sent to the clinical staff for review and that they should receive a response within 2 business days.

## 2022-10-09 MED ORDER — BUPROPION HCL ER (XL) 300 MG PO TB24: 300.0000 mg | ORAL_TABLET | Freq: Every day | ORAL | 0 refills | Status: DC

## 2022-10-09 MED ORDER — ATORVASTATIN CALCIUM 80 MG PO TABS: 80.0000 mg | ORAL_TABLET | Freq: Every day | ORAL | 0 refills | Status: DC

## 2022-10-09 NOTE — Telephone Encounter (Signed)
Pt needed his Wellbutrin & Atorvastatin refilled, sent to pharmacy

## 2022-11-05 ENCOUNTER — Encounter: Payer: Self-pay | Admitting: Cardiology

## 2022-11-05 ENCOUNTER — Ambulatory Visit: Payer: BC Managed Care – PPO | Attending: Cardiology | Admitting: Cardiology

## 2022-11-05 VITALS — BP 122/80 | HR 80 | Ht 75.0 in | Wt 294.0 lb

## 2022-11-05 DIAGNOSIS — I77819 Aortic ectasia, unspecified site: Secondary | ICD-10-CM

## 2022-11-05 DIAGNOSIS — E785 Hyperlipidemia, unspecified: Secondary | ICD-10-CM

## 2022-11-05 DIAGNOSIS — I2510 Atherosclerotic heart disease of native coronary artery without angina pectoris: Secondary | ICD-10-CM

## 2022-11-05 DIAGNOSIS — I10 Essential (primary) hypertension: Secondary | ICD-10-CM

## 2022-11-05 NOTE — Progress Notes (Signed)
Cardiology Office Note:    Date:  11/05/2022   ID:  Jonathan Barrett, DOB 1965-08-05, MRN [id number]  PCP:  Dettinger, Elige Radon, MD  Cardiologist:  Little Ishikawa, MD  Electrophysiologist:  None   Referring MD: Dettinger, Elige Radon, MD   Chief Complaint  Patient presents with   Follow-up    6 months.   Coronary Artery Disease   History of Present Illness:    Jonathan Barrett is a 57 y.o. male with a hx of CAD status post LCx PCI 02/13/2020, renal cell carcinoma, hypertension, hyperlipidemia who presents for follow-up.  He was initially seen on 02/01/2020.  Reported symptoms consistent with typical angina.  Cardiac catheterization on 02/13/2020 showed severe two-vessel obstructive CAD (85% proximal LCx stenosis, CTO of mid RCA with left-to-right collaterals, 65% ramus, 35% proximal to mid LAD, aneurysmal dilatation of distal left main), normal LV function, mild elevation in LVEDP 20 mmHg.  Treated with DES to proximal LCx.  Echocardiogram on 03/17/2019 showed normal biventricular function, grade 1 diastolic dysfunction, mild dilatation of aortic root measuring 42 mm.  CTA chest on 03/26/2020 showed mild aortic root dilatation measuring 44 mm, 40 mm in ascending aorta.  Also noted to have 4.1 x 3.5 cm mass in the right kidney consistent with renal cell carcinoma.  He was referred to urology and underwent partial nephrectomy on 06/04/2020 with Dr. Laverle Patter.  Echocardiogram 05/12/2022 showed EF 70 to 75%, grade 1 diastolic dysfunction, normal RV function, no significant valvular disease, dilated aorta measuring 41 mm in aortic root and 40 mm and ascending aorta.  Since last clinic visit, he reports he is doing well.  Has lost nearly 50 pounds. Denies any chest pain, dyspnea, lightheadedness, syncope, lower extremity edema, or palpitations.  He walks 2 to 3 days/week for 30 to 45 minutes.  Reports compliance with CPAP.   Wt Readings from Last 3 Encounters:  11/05/22 294 lb (133.4 kg)  11/07/21 (!) 332  lb 9.6 oz (150.9 kg)  09/23/21 (!) 328 lb (148.8 kg)     Past Medical History:  Diagnosis Date   Coronary artery disease    DES   Hyperlipidemia    Hypertension     Past Surgical History:  Procedure Laterality Date   CORONARY STENT INTERVENTION N/A 02/13/2020   Procedure: CORONARY STENT INTERVENTION;  Surgeon: Swaziland, Peter M, MD;  Location: [facility] INVASIVE CV LAB;  Service: Cardiovascular;  Laterality: N/A;   CYST EXCISION     LEFT HEART CATH AND CORONARY ANGIOGRAPHY N/A 02/13/2020   Procedure: LEFT HEART CATH AND CORONARY ANGIOGRAPHY;  Surgeon: Swaziland, Peter M, MD;  Location: [facility] INVASIVE CV LAB;  Service: Cardiovascular;  Laterality: N/A;   MIDDLE EAR SURGERY     ROBOTIC ASSITED PARTIAL NEPHRECTOMY Right 06/04/2020   Procedure: XI ROBOTIC ASSITED PARTIAL NEPHRECTOMY;  Surgeon: Heloise Purpura, MD;  Location: [facility];  Service: Urology;  Laterality: Right;    Current Medications: Current Meds  Medication Sig   amLODipine (NORVASC) 10 MG tablet TAKE 1 TABLET BY MOUTH EVERY DAY   aspirin EC 81 MG tablet Take 1 tablet (81 mg total) by mouth daily. Swallow whole.   atorvastatin (LIPITOR) 80 MG tablet Take 1 tablet (80 mg total) by mouth daily.   buPROPion (WELLBUTRIN XL) 300 MG 24 hr tablet Take 1 tablet (300 mg total) by mouth daily.   carvedilol (COREG) 12.5 MG tablet Take 1 tablet (12.5 mg total) by mouth 2 (two) times daily.   cephALEXin (KEFLEX) 500 MG capsule Take  1 capsule (500 mg total) by mouth 4 (four) times daily.   docusate sodium (COLACE) 100 MG capsule Take 1 capsule (100 mg total) by mouth 2 (two) times daily.   hydrochlorothiazide (HYDRODIURIL) 25 MG tablet Take 1 tablet (25 mg total) by mouth daily.   nitroGLYCERIN (NITROSTAT) 0.4 MG SL tablet Place 0.4 mg under the tongue every 5 (five) minutes as needed for chest pain.   ramipril (ALTACE) 10 MG capsule TAKE 1 CAPSULE BY MOUTH TWICE A DAY   spironolactone (ALDACTONE) 25 MG tablet TAKE 1 TABLET (25 MG TOTAL) BY MOUTH DAILY.      Allergies:   Patient has no known allergies.   Social History   Socioeconomic History   Marital status: Married    Spouse name: Not on file   Number of children: Not on file   Years of education: Not on file   Highest education level: Not on file  Occupational History   Not on file  Tobacco Use   Smoking status: Never   Smokeless tobacco: Never  Vaping Use   Vaping status: Never Used  Substance and Sexual Activity   Alcohol use: Yes    Alcohol/week: 12.0 standard drinks of alcohol    Types: 12 Cans of beer per week    Comment: occas.   Drug use: No   Sexual activity: Yes  Other Topics Concern   Not on file  Social History Narrative   Not on file   Social Determinants of Health   Financial Resource Strain: Not on file  Food Insecurity: Not on file  Transportation Needs: Not on file  Physical Activity: Not on file  Stress: Not on file  Social Connections: Unknown (06/18/2021)   Received from [facility], [facility]   Social Network    Social Network: Not on file     Family History: The patient's family history includes Heart disease in his father.  ROS:   Please see the history of present illness.     All other systems reviewed and are negative.  EKGs/Labs/Other Studies Reviewed:    The following studies were reviewed today:   EKG:   11/05/22: Normal sinus rhythm, rate 80, no ST abnormalities 11/07/21: Normal sinus rhythm, rate 79, no ST abnormality 05/23/2021: Normal sinus rhythm, rate 90, no ST abnormalities  Recent Labs: No results found for requested labs within last 365 days.  Recent Lipid Panel    Component Value Date/Time   CHOL 125 07/11/2021 1555   CHOL 212 (H) 06/22/2012 0946   TRIG 136 07/11/2021 1555   TRIG 174 (H) 01/31/2013 1216   TRIG 136 06/22/2012 0946   HDL 39 (L) 07/11/2021 1555   HDL 51 01/31/2013 1216   HDL 49 06/22/2012 0946   CHOLHDL 3.2 07/11/2021 1555   LDLCALC 62 07/11/2021 1555   LDLCALC 107 (H) 01/31/2013 1216    LDLCALC 136 (H) 06/22/2012 0946    Physical Exam:    VS:  BP 122/80 (BP Location: Left Arm, Patient Position: Sitting, Cuff Size: Large)   Pulse 80   Ht 6\' 3"  (1.905 m)   Wt 294 lb (133.4 kg)   BMI 36.75 kg/m     Wt Readings from Last 3 Encounters:  11/05/22 294 lb (133.4 kg)  11/07/21 (!) 332 lb 9.6 oz (150.9 kg)  09/23/21 (!) 328 lb (148.8 kg)     GEN:  in no acute distress HEENT: Normal NECK: No JVD; No carotid bruits CARDIAC: RRR, no murmurs, rubs, gallops RESPIRATORY:  Clear to auscultation without rales, wheezing or rhonchi  ABDOMEN: Soft, non-tender, non-distended MUSCULOSKELETAL:  No edema; No deformity  SKIN: Warm and dry NEUROLOGIC:  Alert and oriented x 3 PSYCHIATRIC:  Normal affect   ASSESSMENT:    1. CAD in native artery   2. Primary hypertension   3. Hyperlipidemia, unspecified hyperlipidemia type   4. Aortic dilatation (HCC)   5. Morbid obesity (HCC)     PLAN:    CAD: Reported symptoms consistent with typical angina.  Cardiac catheterization on 02/13/2020 showed severe two-vessel obstructive CAD (85% proximal LCx stenosis, CTO of mid RCA with left-to-right collaterals, 65% ramus, 35% proximal to mid LAD, aneurysmal dilatation of distal left main), normal LV function, mild elevation in LVEDP 20 mmHg.  Treated with DES to proximal LCx.  Echocardiogram 03/16/2020 shows normal biventricular function.  Reports chest pain resolved since his catheterization. -Continue aspirin 81 mg daily indefinitely.  Completed 1 year of DAPT, discontinued ticagrelor -Continue atorvastatin 80 mg daily -Continue carvedilol 12.5 mg twice daily  Hypertension: On amlodipine 10 mg daily, hydrochlorothiazide 25 mg daily, carvedilol 12.5 mg twice daily, ramipril 10 mg twice daily, spironolactone 25 mg daily.  Suspect untreated OSA contributing to resistant hypertension.  BP is improved since starting CPAP.  Currently appears controlled.  Check BMET, magnesium  Hyperlipidemia: LDL 107  on 02/01/2020.  Switched from simvastatin 40 mg daily to atorvastatin 80 mg daily for goal LDL less than 70.  LDL 62 on 07/11/21.  Check lipid panel  OSA: Sleep study on 10/29/2020 showed severe OSA, on CPAP.  Encouraged compliance.  Aortic dilatation: CTA chest on 03/26/2020 showed aortic root dilatation measuring 44 mm, 40 mm in ascending aorta. Had CT chest 06/2021 (images unavailable but per report no aneurysmal dilatation).  Echocardiogram 05/2022 showed aortic dilatation measuring 41 mm and reviewed and 40 mm in ascending aorta, will monitor  Renal cell carcinoma: noted on CT chest 03/26/20 to have 4.1 x 3.5 cm mass in the right kidney consistent with renal cell carcinoma.  He was referred to urology and underwent partial nephrectomy on 06/04/2020 with Dr. Laverle Patter.  Morbid obesity: Body mass index is 36.75 kg/m.  Referred to pharmacy clinic to evaluate for [facility], but was too expensive.  Encouraged diet/exercise.  He has been doing well, has now lost over 50 pounds.   RTC in 6 months   Medication Adjustments/Labs and Tests Ordered: Current medicines are reviewed at length with the patient today.  Concerns regarding medicines are outlined above.  Orders Placed This Encounter  Procedures   Basic Metabolic Panel (BMET)   Magnesium   CBC w/Diff/Platelet   Lipid panel   EKG 12-Lead    No orders of the defined types were placed in this encounter.    Patient Instructions  Medication Instructions:  Continue all current medications *If you need a refill on your cardiac medications before your next appointment, please call your pharmacy*   Lab Work:  BMET, Mg, CBC, Lipid   If you have labs (blood work) drawn today and your tests are completely normal, you will receive your results only by: MyChart Message (if you have MyChart) OR A paper copy in the mail If you have any lab test that is abnormal or we need to change your treatment, we will call you to review the  results.   Testing/Procedures: None  Follow-Up: At [facility], you and your health needs are our priority.  As part of our continuing mission to provide you with exceptional  heart care, we have created designated Provider Care Teams.  These Care Teams include your primary Cardiologist (physician) and Advanced Practice Providers (APPs -  Physician Assistants and Nurse Practitioners) who all work together to provide you with the care you need, when you need it.  We recommend signing up for the patient portal called "MyChart".  Sign up information is provided on this After Visit Summary.  MyChart is used to connect with patients for Virtual Visits (Telemedicine).  Patients are able to view lab/test results, encounter notes, upcoming appointments, etc.  Non-urgent messages can be sent to your provider as well.   To learn more about what you can do with MyChart, go to [email or web address].    Your next appointment:    6 months  please call office in Jan 2025 to schedule appt for April  Provider:   Dr. Darryl Nestle   Signed, Little Ishikawa, MD  11/05/2022 5:14 PM    [facility]

## 2022-11-05 NOTE — Patient Instructions (Addendum)
Medication Instructions:  Continue all current medications *If you need a refill on your cardiac medications before your next appointment, please call your pharmacy*   Lab Work:  BMET, Mg, CBC, Lipid   If you have labs (blood work) drawn today and your tests are completely normal, you will receive your results only by: MyChart Message (if you have MyChart) OR A paper copy in the mail If you have any lab test that is abnormal or we need to change your treatment, we will call you to review the results.   Testing/Procedures: None  Follow-Up: At [facility], you and your health needs are our priority.  As part of our continuing mission to provide you with exceptional heart care, we have created designated Provider Care Teams.  These Care Teams include your primary Cardiologist (physician) and Advanced Practice Providers (APPs -  Physician Assistants and Nurse Practitioners) who all work together to provide you with the care you need, when you need it.  We recommend signing up for the patient portal called "MyChart".  Sign up information is provided on this After Visit Summary.  MyChart is used to connect with patients for Virtual Visits (Telemedicine).  Patients are able to view lab/test results, encounter notes, upcoming appointments, etc.  Non-urgent messages can be sent to your provider as well.   To learn more about what you can do with MyChart, go to [email or web address].    Your next appointment:    6 months  please call office in Jan 2025 to schedule appt for April  Provider:   Dr. Darryl Nestle

## 2022-11-07 ENCOUNTER — Ambulatory Visit: Payer: BC Managed Care – PPO | Admitting: Family Medicine

## 2022-11-07 ENCOUNTER — Encounter: Payer: Self-pay | Admitting: Family Medicine

## 2022-11-07 VITALS — BP 98/70 | HR 82 | Ht 75.0 in | Wt 299.0 lb

## 2022-11-07 DIAGNOSIS — Z1211 Encounter for screening for malignant neoplasm of colon: Secondary | ICD-10-CM

## 2022-11-07 DIAGNOSIS — I10 Essential (primary) hypertension: Secondary | ICD-10-CM | POA: Diagnosis not present

## 2022-11-07 DIAGNOSIS — E782 Mixed hyperlipidemia: Secondary | ICD-10-CM | POA: Diagnosis not present

## 2022-11-07 DIAGNOSIS — F411 Generalized anxiety disorder: Secondary | ICD-10-CM | POA: Diagnosis not present

## 2022-11-07 MED ORDER — ATORVASTATIN CALCIUM 80 MG PO TABS: 80.0000 mg | ORAL_TABLET | Freq: Every day | ORAL | 3 refills | Status: DC

## 2022-11-07 MED ORDER — CARVEDILOL 12.5 MG PO TABS: 12.5000 mg | ORAL_TABLET | Freq: Two times a day (BID) | ORAL | 3 refills | Status: DC

## 2022-11-07 MED ORDER — BUPROPION HCL ER (XL) 300 MG PO TB24: 300.0000 mg | ORAL_TABLET | Freq: Every day | ORAL | 3 refills | Status: DC

## 2022-11-07 MED ORDER — HYDROCHLOROTHIAZIDE 25 MG PO TABS: 25.0000 mg | ORAL_TABLET | Freq: Every day | ORAL | 3 refills | Status: DC

## 2022-11-07 NOTE — Progress Notes (Signed)
BP 98/70   Pulse 82   Ht 6\' 3"  (1.905 m)   Wt 299 lb (135.6 kg)   SpO2 96%   BMI 37.37 kg/m    Subjective:   Patient ID: [name], male    DOB: 1965/12/28, 57 y.o.   MRN: [id number]  HPI: Zandyr Meader is a 58 y.o. male presenting on 11/07/2022 for Medical Management of Chronic Issues, Hypertension, and Hyperlipidemia   HPI Hypertension Patient is currently on amlodipine and spironolactone and ramipril and hydrochlorothiazide and carvedilol, and their blood pressure today is 98/70, he says at home it typically runs 120/70. Patient denies any lightheadedness or dizziness. Patient denies headaches, blurred vision, chest pains, shortness of breath, or weakness. Denies any side effects from medication and is content with current medication.   Hyperlipidemia Patient is coming in for recheck of his hyperlipidemia. The patient is currently taking atorvastatin. They deny any issues with myalgias or history of liver damage from it. They deny any focal numbness or weakness or chest pain.   Anxiety recheck Patient is coming in today for anxiety recheck.  He currently takes Wellbutrin 300 mg daily.  He says he is doing well with the Wellbutrin and denies any major issues    11/07/2022    1:34 PM 07/11/2021    3:21 PM 03/22/2019    3:36 PM 11/30/2017    9:00 AM 04/27/2017    9:14 AM  Depression screen PHQ 2/9  Decreased Interest 0 0 0 0 0  Down, Depressed, Hopeless 0 0 0 0 0  PHQ - 2 Score 0 0 0 0 0  Altered sleeping 0 0 0    Tired, decreased energy 0 0 0    Change in appetite 0 0 0    Feeling bad or failure about yourself  0 0 0    Trouble concentrating 0 0 0    Moving slowly or fidgety/restless 0 0 0    Suicidal thoughts 0 0 0    PHQ-9 Score 0 0 0    Difficult doing work/chores Not difficult at all Not difficult at all        Relevant past medical, surgical, family and social history reviewed and updated as indicated. Interim medical history since our last visit  reviewed. Allergies and medications reviewed and updated.  Review of Systems  Constitutional:  Negative for chills and fever.  Eyes:  Negative for visual disturbance.  Respiratory:  Negative for shortness of breath and wheezing.   Cardiovascular:  Negative for chest pain and leg swelling.  Musculoskeletal:  Negative for back pain and gait problem.  Skin:  Negative for rash.  All other systems reviewed and are negative.   Per HPI unless specifically indicated above   Allergies as of 11/07/2022   No Known Allergies      Medication List        Accurate as of November 07, 2022  2:01 PM. If you have any questions, ask your nurse or doctor.          STOP taking these medications    cephALEXin 500 MG capsule Commonly known as: KEFLEX Stopped by: Elige Radon Levonne Carreras       TAKE these medications    amLODipine 10 MG tablet Commonly known as: NORVASC TAKE 1 TABLET BY MOUTH EVERY DAY   aspirin EC 81 MG tablet Take 1 tablet (81 mg total) by mouth daily. Swallow whole.   atorvastatin 80 MG tablet Commonly known as: LIPITOR Take 1 tablet (80  mg total) by mouth daily.   buPROPion 300 MG 24 hr tablet Commonly known as: WELLBUTRIN XL Take 1 tablet (300 mg total) by mouth daily.   carvedilol 12.5 MG tablet Commonly known as: COREG Take 1 tablet (12.5 mg total) by mouth 2 (two) times daily.   docusate sodium 100 MG capsule Commonly known as: COLACE Take 1 capsule (100 mg total) by mouth 2 (two) times daily.   hydrochlorothiazide 25 MG tablet Commonly known as: HYDRODIURIL Take 1 tablet (25 mg total) by mouth daily.   nitroGLYCERIN 0.4 MG SL tablet Commonly known as: NITROSTAT Place 0.4 mg under the tongue every 5 (five) minutes as needed for chest pain.   ramipril 10 MG capsule Commonly known as: ALTACE TAKE 1 CAPSULE BY MOUTH TWICE A DAY   spironolactone 25 MG tablet Commonly known as: ALDACTONE TAKE 1 TABLET (25 MG TOTAL) BY MOUTH DAILY.          Objective:   BP 98/70   Pulse 82   Ht 6\' 3"  (1.905 m)   Wt 299 lb (135.6 kg)   SpO2 96%   BMI 37.37 kg/m   Wt Readings from Last 3 Encounters:  11/07/22 299 lb (135.6 kg)  11/05/22 294 lb (133.4 kg)  11/07/21 (!) 332 lb 9.6 oz (150.9 kg)    Physical Exam Vitals and nursing note reviewed.  Constitutional:      General: He is not in acute distress.    Appearance: He is well-developed. He is not diaphoretic.  Eyes:     General: No scleral icterus.    Conjunctiva/sclera: Conjunctivae normal.  Neck:     Thyroid: No thyromegaly.  Cardiovascular:     Rate and Rhythm: Normal rate and regular rhythm.     Heart sounds: Normal heart sounds. No murmur heard. Pulmonary:     Effort: Pulmonary effort is normal. No respiratory distress.     Breath sounds: Normal breath sounds. No wheezing.  Musculoskeletal:        General: No swelling. Normal range of motion.     Cervical back: Neck supple.  Lymphadenopathy:     Cervical: No cervical adenopathy.  Skin:    General: Skin is warm and dry.     Findings: No rash.  Neurological:     Mental Status: He is alert and oriented to person, place, and time.     Coordination: Coordination normal.  Psychiatric:        Behavior: Behavior normal.       Assessment & Plan:   Problem List Items Addressed This Visit       Cardiovascular and Mediastinum   Hypertension   Relevant Medications   atorvastatin (LIPITOR) 80 MG tablet   carvedilol (COREG) 12.5 MG tablet   hydrochlorothiazide (HYDRODIURIL) 25 MG tablet   Other Relevant Orders   CBC with Differential/Platelet   CMP14+EGFR   Lipid panel     Other   Hyperlipidemia - Primary   Relevant Medications   atorvastatin (LIPITOR) 80 MG tablet   carvedilol (COREG) 12.5 MG tablet   hydrochlorothiazide (HYDRODIURIL) 25 MG tablet   Other Relevant Orders   CBC with Differential/Platelet   CMP14+EGFR   Lipid panel   GAD (generalized anxiety disorder)   Relevant Medications    buPROPion (WELLBUTRIN XL) 300 MG 24 hr tablet   Other Relevant Orders   CBC with Differential/Platelet   CMP14+EGFR   Lipid panel   Other Visit Diagnoses     Colon cancer screening  Relevant Orders   Ambulatory referral to Gastroenterology       Blood pressure looks good without any symptoms, on the lower end but he will keep an eye on it.  If he starts getting lightheadedness or dizziness, he may have to back off on the medicine.  Will do blood work today. Follow up plan: Return in about 6 months (around 05/08/2023), or if symptoms worsen or fail to improve, for Physical exam and hypertension and cholesterol.  Counseling provided for all of the vaccine components Orders Placed This Encounter  Procedures   CBC with Differential/Platelet   CMP14+EGFR   Lipid panel   Ambulatory referral to Gastroenterology    Arville Care, MD [facility] 11/07/2022, 2:01 PM

## 2022-11-08 LAB — CBC WITH DIFFERENTIAL/PLATELET
Basophils Absolute: 0.1 10*3/uL (ref 0.0–0.2)
Basos: 0 %
EOS (ABSOLUTE): 0.2 10*3/uL (ref 0.0–0.4)
Eos: 2 %
Hematocrit: 44.5 % (ref 37.5–51.0)
Hemoglobin: 14.6 g/dL (ref 13.0–17.7)
Immature Grans (Abs): 0.1 10*3/uL (ref 0.0–0.1)
Immature Granulocytes: 1 %
Lymphocytes Absolute: 2 10*3/uL (ref 0.7–3.1)
Lymphs: 16 %
MCH: 31.5 pg (ref 26.6–33.0)
MCHC: 32.8 g/dL (ref 31.5–35.7)
MCV: 96 fL (ref 79–97)
Monocytes Absolute: 0.9 10*3/uL (ref 0.1–0.9)
Monocytes: 7 %
Neutrophils Absolute: 9.3 10*3/uL — ABNORMAL HIGH (ref 1.4–7.0)
Neutrophils: 74 %
Platelets: 227 10*3/uL (ref 150–450)
RBC: 4.64 x10E6/uL (ref 4.14–5.80)
RDW: 12.1 % (ref 11.6–15.4)
WBC: 12.6 10*3/uL — ABNORMAL HIGH (ref 3.4–10.8)

## 2022-11-08 LAB — CMP14+EGFR
ALT: 23 IU/L (ref 0–44)
AST: 18 IU/L (ref 0–40)
Albumin: 4.1 g/dL (ref 3.8–4.9)
Alkaline Phosphatase: 86 IU/L (ref 44–121)
BUN/Creatinine Ratio: 17 (ref 9–20)
BUN: 22 mg/dL (ref 6–24)
Bilirubin Total: 0.6 mg/dL (ref 0.0–1.2)
CO2: 23 mmol/L (ref 20–29)
Calcium: 9.5 mg/dL (ref 8.7–10.2)
Chloride: 101 mmol/L (ref 96–106)
Creatinine, Ser: 1.33 mg/dL — ABNORMAL HIGH (ref 0.76–1.27)
Globulin, Total: 3 g/dL (ref 1.5–4.5)
Glucose: 122 mg/dL — ABNORMAL HIGH (ref 70–99)
Potassium: 3.9 mmol/L (ref 3.5–5.2)
Sodium: 140 mmol/L (ref 134–144)
Total Protein: 7.1 g/dL (ref 6.0–8.5)
eGFR: 62 mL/min/{1.73_m2} (ref >59)

## 2022-11-08 LAB — LIPID PANEL
Cholesterol, Total: 131 mg/dL (ref 100–199)
HDL: 46 mg/dL (ref >39)
LDL CALC COMMENT:: 2.8 ratio (ref 0.0–5.0)
LDL Chol Calc (NIH): 67 mg/dL (ref 0–99)
Triglycerides: 94 mg/dL (ref 0–149)
VLDL Cholesterol Cal: 18 mg/dL (ref 5–40)

## 2022-12-24 ENCOUNTER — Other Ambulatory Visit: Payer: Self-pay | Admitting: Cardiology

## 2023-03-04 ENCOUNTER — Ambulatory Visit: Payer: Managed Care, Other (non HMO) | Admitting: Family Medicine

## 2023-03-04 ENCOUNTER — Encounter: Payer: Self-pay | Admitting: Family Medicine

## 2023-03-04 VITALS — BP 123/81 | HR 93 | Ht 75.0 in | Wt 314.0 lb

## 2023-03-04 DIAGNOSIS — I10 Essential (primary) hypertension: Secondary | ICD-10-CM

## 2023-03-04 DIAGNOSIS — M109 Gout, unspecified: Secondary | ICD-10-CM | POA: Diagnosis not present

## 2023-03-04 DIAGNOSIS — E782 Mixed hyperlipidemia: Secondary | ICD-10-CM

## 2023-03-04 DIAGNOSIS — M720 Palmar fascial fibromatosis [Dupuytren]: Secondary | ICD-10-CM

## 2023-03-04 DIAGNOSIS — G4730 Sleep apnea, unspecified: Secondary | ICD-10-CM | POA: Insufficient documentation

## 2023-03-04 DIAGNOSIS — G4733 Obstructive sleep apnea (adult) (pediatric): Secondary | ICD-10-CM

## 2023-03-04 MED ORDER — SEMAGLUTIDE-WEIGHT MANAGEMENT 1 MG/0.5ML ~~LOC~~ SOAJ: 1.0000 mg | SUBCUTANEOUS | 0 refills | Status: DC

## 2023-03-04 MED ORDER — SEMAGLUTIDE-WEIGHT MANAGEMENT 2.4 MG/0.75ML ~~LOC~~ SOAJ: 2.4000 mg | SUBCUTANEOUS | 0 refills | Status: DC

## 2023-03-04 MED ORDER — SEMAGLUTIDE-WEIGHT MANAGEMENT 0.25 MG/0.5ML ~~LOC~~ SOAJ
0.2500 mg | SUBCUTANEOUS | 0 refills | Status: AC
Start: 2023-03-04 — End: 2023-04-01

## 2023-03-04 MED ORDER — SEMAGLUTIDE-WEIGHT MANAGEMENT 0.5 MG/0.5ML ~~LOC~~ SOAJ: 0.5000 mg | SUBCUTANEOUS | 0 refills | Status: DC

## 2023-03-04 MED ORDER — SEMAGLUTIDE-WEIGHT MANAGEMENT 1.7 MG/0.75ML ~~LOC~~ SOAJ: 1.7000 mg | SUBCUTANEOUS | 0 refills | Status: DC

## 2023-03-04 NOTE — Progress Notes (Signed)
BP 123/81   Pulse 93   Ht 6\' 3"  (1.905 m)   Wt (!) 314 lb (142.4 kg)   SpO2 97%   BMI 39.25 kg/m    Subjective:   Patient ID: Jonathan Barrett, male    DOB: [birth year], 58 y.o.   MRN: [id number]  HPI: Jonathan Barrett is a 58 y.o. male presenting on 03/04/2023 for Medical Management of Chronic Issues, Hyperlipidemia, and Hypertension   HPI Hypertension Patient is currently on hydrochlorothiazide and amlodipine and ramipril and spironolactone and carvedilol, and their blood pressure today is 123/81. Patient denies any lightheadedness or dizziness. Patient denies headaches, blurred vision, chest pains, shortness of breath, or weakness. Denies any side effects from medication and is content with current medication.   Hyperlipidemia Patient is coming in for recheck of his hyperlipidemia. The patient is currently taking atorvastatin. They deny any issues with myalgias or history of liver damage from it. They deny any focal numbness or weakness or chest pain.   Anxiety recheck Patient is coming in today for anxiety recheck.  He is currently taking Wellbutrin.  He feels like he is doing okay on the Wellbutrin.    03/04/2023    1:35 PM 11/07/2022    1:34 PM 07/11/2021    3:21 PM 03/22/2019    3:36 PM 11/30/2017    9:00 AM  Depression screen PHQ 2/9  Decreased Interest 0 0 0 0 0  Down, Depressed, Hopeless 0 0 0 0 0  PHQ - 2 Score 0 0 0 0 0  Altered sleeping 1 0 0 0   Tired, decreased energy 0 0 0 0   Change in appetite 1 0 0 0   Feeling bad or failure about yourself  0 0 0 0   Trouble concentrating 0 0 0 0   Moving slowly or fidgety/restless 0 0 0 0   Suicidal thoughts 0 0 0 0   PHQ-9 Score 2 0 0 0   Difficult doing work/chores Not difficult at all Not difficult at all Not difficult at all      Patient has contractures that he has noticed on his left hand on the fifth tendon that is been developing over the past year and is gotten to the point where he has trouble even putting on gloves  because of the contracture.  Patient has morbid obesity and sleep apnea and is been trying to lose weight and is currently on a CPAP machine but he is struggling with the CPAP machine.  He would like to try to have something to help him lose weight so he can do better on the sleep apnea machine or even get off of it.  Relevant past medical, surgical, family and social history reviewed and updated as indicated. Interim medical history since our last visit reviewed. Allergies and medications reviewed and updated.  Review of Systems  Constitutional:  Negative for chills and fever.  Eyes:  Negative for visual disturbance.  Respiratory:  Negative for shortness of breath and wheezing.   Cardiovascular:  Negative for chest pain and leg swelling.  Musculoskeletal:  Negative for back pain and gait problem.  Skin:  Negative for rash.  Neurological:  Negative for dizziness and light-headedness.  All other systems reviewed and are negative.   Per HPI unless specifically indicated above   Allergies as of 03/04/2023   No Known Allergies      Medication List        Accurate as of March 04, 2023  2:16  PM. If you have any questions, ask your nurse or doctor.          STOP taking these medications    docusate sodium 100 MG capsule Commonly known as: COLACE Stopped by: Elige Radon Corlette Ciano       TAKE these medications    amLODipine 10 MG tablet Commonly known as: NORVASC TAKE 1 TABLET BY MOUTH EVERY DAY   aspirin EC 81 MG tablet Take 1 tablet (81 mg total) by mouth daily. Swallow whole.   atorvastatin 80 MG tablet Commonly known as: LIPITOR Take 1 tablet (80 mg total) by mouth daily.   buPROPion 300 MG 24 hr tablet Commonly known as: WELLBUTRIN XL Take 1 tablet (300 mg total) by mouth daily.   carvedilol 12.5 MG tablet Commonly known as: COREG Take 1 tablet (12.5 mg total) by mouth 2 (two) times daily.   hydrochlorothiazide 25 MG tablet Commonly known as:  HYDRODIURIL Take 1 tablet (25 mg total) by mouth daily.   nitroGLYCERIN 0.4 MG SL tablet Commonly known as: NITROSTAT Place 0.4 mg under the tongue every 5 (five) minutes as needed for chest pain.   ramipril 10 MG capsule Commonly known as: ALTACE TAKE 1 CAPSULE BY MOUTH TWICE A DAY   Semaglutide-Weight Management 0.25 MG/0.5ML Soaj Inject 0.25 mg into the skin once a week for 28 days. Started by: Elige Radon Derya Dettmann   Semaglutide-Weight Management 0.5 MG/0.5ML Soaj Inject 0.5 mg into the skin once a week for 28 days. Start taking on: April 02, 2023 Started by: Elige Radon Tarrance Januszewski   Semaglutide-Weight Management 1 MG/0.5ML Soaj Inject 1 mg into the skin once a week for 28 days. Start taking on: May 01, 2023 Started by: Elige Radon Caeleb Batalla   Semaglutide-Weight Management 1.7 MG/0.75ML Soaj Inject 1.7 mg into the skin once a week for 28 days. Start taking on: May 30, 2023 Started by: Elige Radon Irais Mottram   Semaglutide-Weight Management 2.4 MG/0.75ML Soaj Inject 2.4 mg into the skin once a week for 28 days. Start taking on: Jun 28, 2023 Started by: Elige Radon Antonio Woodhams   spironolactone 25 MG tablet Commonly known as: ALDACTONE TAKE 1 TABLET (25 MG TOTAL) BY MOUTH DAILY.         Objective:   BP 123/81   Pulse 93   Ht 6\' 3"  (1.905 m)   Wt (!) 314 lb (142.4 kg)   SpO2 97%   BMI 39.25 kg/m   Wt Readings from Last 3 Encounters:  03/04/23 (!) 314 lb (142.4 kg)  11/07/22 299 lb (135.6 kg)  11/05/22 294 lb (133.4 kg)    Physical Exam Vitals and nursing note reviewed.  Constitutional:      General: He is not in acute distress.    Appearance: He is well-developed. He is obese. He is not diaphoretic.  Eyes:     General: No scleral icterus.    Conjunctiva/sclera: Conjunctivae normal.  Neck:     Thyroid: No thyromegaly.  Cardiovascular:     Rate and Rhythm: Normal rate and regular rhythm.     Heart sounds: Normal heart sounds. No murmur heard. Pulmonary:      Effort: Pulmonary effort is normal. No respiratory distress.     Breath sounds: Normal breath sounds. No wheezing.  Musculoskeletal:        General: Normal range of motion.     Cervical back: Neck supple.     Comments: Significant Dupuytren's contracture of the left fifth tendon and the start of a small contracture  on the right fifth tendon  Lymphadenopathy:     Cervical: No cervical adenopathy.  Skin:    General: Skin is warm and dry.     Findings: No rash.  Neurological:     Mental Status: He is alert and oriented to person, place, and time.     Coordination: Coordination normal.  Psychiatric:        Behavior: Behavior normal.       Assessment & Plan:   Problem List Items Addressed This Visit       Cardiovascular and Mediastinum   Hypertension - Primary   Relevant Orders   CBC with Differential/Platelet   CMP14+EGFR   Bayer DCA Hb A1c Waived     Respiratory   Sleep apnea   Relevant Medications   Semaglutide-Weight Management 0.25 MG/0.5ML SOAJ   Semaglutide-Weight Management 0.5 MG/0.5ML SOAJ (Start on 04/02/2023)   Semaglutide-Weight Management 1 MG/0.5ML SOAJ (Start on 05/01/2023)   Semaglutide-Weight Management 1.7 MG/0.75ML SOAJ (Start on 05/30/2023)   Semaglutide-Weight Management 2.4 MG/0.75ML SOAJ (Start on 06/28/2023)     Other   Hyperlipidemia   Relevant Orders   CBC with Differential/Platelet   CMP14+EGFR   Bayer DCA Hb A1c Waived   Gout   Relevant Orders   Uric acid   Morbid obesity (HCC)   Relevant Medications   Semaglutide-Weight Management 0.25 MG/0.5ML SOAJ   Semaglutide-Weight Management 0.5 MG/0.5ML SOAJ (Start on 04/02/2023)   Semaglutide-Weight Management 1 MG/0.5ML SOAJ (Start on 05/01/2023)   Semaglutide-Weight Management 1.7 MG/0.75ML SOAJ (Start on 05/30/2023)   Semaglutide-Weight Management 2.4 MG/0.75ML SOAJ (Start on 06/28/2023)   Other Visit Diagnoses       Dupuytren's contracture       Relevant Orders   Ambulatory referral to  Orthopedic Surgery       Patient's BMI is >30 mg/m2.  Patient's current BMI is Body mass index is 39.25 kg/m.Marland Kitchen  Patient is currently enrolled in a healthy eating plan along with encouraged exercise.  Patient is already on Wellbutrin and with hypertension would not be a good candidate for phentermine patient has contraindications to phentermine, Contrave & Qsymia (contains phentermine).  Patient does not have a personal or family history of medullary thyroid carcinoma (MTC) or Multiple Endocrine Neoplasia syndrome type 2 (MEN 2).  Follow up plan: Return in about 6 months (around 09/01/2023), or if symptoms worsen or fail to improve, for Hypertension and hyperlipidemia.  Counseling provided for all of the vaccine components Orders Placed This Encounter  Procedures   CBC with Differential/Platelet   CMP14+EGFR   Bayer DCA Hb A1c Waived   Uric acid   Ambulatory referral to Orthopedic Surgery    Arville Care, MD [facility] 03/04/2023, 2:16 PM

## 2023-03-18 ENCOUNTER — Other Ambulatory Visit: Payer: Self-pay | Admitting: Cardiology

## 2023-03-27 ENCOUNTER — Ambulatory Visit: Payer: Managed Care, Other (non HMO)

## 2023-03-27 VITALS — Ht 75.0 in | Wt 315.0 lb

## 2023-03-27 DIAGNOSIS — Z1211 Encounter for screening for malignant neoplasm of colon: Secondary | ICD-10-CM

## 2023-03-27 MED ORDER — SUFLAVE 178.7 G PO SOLR: 1.0000 | Freq: Once | ORAL | 0 refills | Status: AC

## 2023-03-27 NOTE — Progress Notes (Signed)
No egg or soy allergy known to patient  No issues known to pt with past sedation with any surgeries or procedures Patient denies ever being told they had issues or difficulty with intubation  No FH of Malignant Hyperthermia Pt is not on diet pills Pt is not on  home 02  Pt is not on blood thinners  Pt denies issues with constipation  No A fib or A flutter Have any cardiac testing pending--No Pt can ambulate  Pt denies use of chewing tobacco Discussed diabetic I weight loss medication holds Discussed NSAID holds Checked BMI Pt instructed to use Single[employer]com or GoodRx for a price reduction on prep  Pre visit completed

## 2023-04-16 ENCOUNTER — Encounter: Payer: Self-pay | Admitting: Gastroenterology

## 2023-04-24 ENCOUNTER — Encounter: Payer: Self-pay | Admitting: Gastroenterology

## 2023-04-24 ENCOUNTER — Ambulatory Visit: Payer: Self-pay | Admitting: Gastroenterology

## 2023-04-24 VITALS — BP 142/84 | HR 86 | Temp 98.0°F | Resp 12 | Ht 75.0 in | Wt 315.0 lb

## 2023-04-24 DIAGNOSIS — D124 Benign neoplasm of descending colon: Secondary | ICD-10-CM | POA: Diagnosis not present

## 2023-04-24 DIAGNOSIS — Z1211 Encounter for screening for malignant neoplasm of colon: Secondary | ICD-10-CM

## 2023-04-24 DIAGNOSIS — K621 Rectal polyp: Secondary | ICD-10-CM | POA: Diagnosis not present

## 2023-04-24 DIAGNOSIS — K5730 Diverticulosis of large intestine without perforation or abscess without bleeding: Secondary | ICD-10-CM

## 2023-04-24 DIAGNOSIS — D120 Benign neoplasm of cecum: Secondary | ICD-10-CM

## 2023-04-24 DIAGNOSIS — D123 Benign neoplasm of transverse colon: Secondary | ICD-10-CM | POA: Diagnosis not present

## 2023-04-24 DIAGNOSIS — D122 Benign neoplasm of ascending colon: Secondary | ICD-10-CM | POA: Diagnosis not present

## 2023-04-24 DIAGNOSIS — K644 Residual hemorrhoidal skin tags: Secondary | ICD-10-CM | POA: Diagnosis not present

## 2023-04-24 DIAGNOSIS — K635 Polyp of colon: Secondary | ICD-10-CM | POA: Diagnosis not present

## 2023-04-24 DIAGNOSIS — K648 Other hemorrhoids: Secondary | ICD-10-CM

## 2023-04-24 DIAGNOSIS — D128 Benign neoplasm of rectum: Secondary | ICD-10-CM

## 2023-04-24 MED ORDER — SODIUM CHLORIDE 0.9 % IV SOLN
500.0000 mL | Freq: Once | INTRAVENOUS | Status: DC
Start: 2023-04-24 — End: 2023-04-24

## 2023-04-24 NOTE — Progress Notes (Signed)
 Pt's states no medical or surgical changes since previsit or office visit.

## 2023-04-24 NOTE — Progress Notes (Signed)
 [facility] History and Physical   Primary Care Physician:  Dettinger, Elige Radon, MD   Reason for Procedure:  Colorectal cancer screening  Plan:    Screening colonoscopy with possible interventions as needed     HPI: Jonathan Barrett is a very pleasant 58 y.o. male here for screening colonoscopy. Denies any nausea, vomiting, abdominal pain, melena or bright red blood per rectum  The risks and benefits as well as alternatives of endoscopic procedure(s) have been discussed and reviewed. All questions answered. The patient agrees to proceed.    Past Medical History:  Diagnosis Date   CHF (congestive heart failure) (HCC)    Coronary artery disease    DES   Hyperlipidemia    Hypertension     Past Surgical History:  Procedure Laterality Date   CORONARY STENT INTERVENTION N/A 02/13/2020   Procedure: CORONARY STENT INTERVENTION;  Surgeon: Swaziland, Peter M, MD;  Location: [facility] INVASIVE CV LAB;  Service: Cardiovascular;  Laterality: N/A;   CYST EXCISION     LEFT HEART CATH AND CORONARY ANGIOGRAPHY N/A 02/13/2020   Procedure: LEFT HEART CATH AND CORONARY ANGIOGRAPHY;  Surgeon: Swaziland, Peter M, MD;  Location: [facility] INVASIVE CV LAB;  Service: Cardiovascular;  Laterality: N/A;   MIDDLE EAR SURGERY     ROBOTIC ASSITED PARTIAL NEPHRECTOMY Right 06/04/2020   Procedure: XI ROBOTIC ASSITED PARTIAL NEPHRECTOMY;  Surgeon: Heloise Purpura, MD;  Location: [facility] ORS;  Service: Urology;  Laterality: Right;    Prior to Admission medications   Medication Sig Start Date End Date Taking? Authorizing Provider  amLODipine (NORVASC) 10 MG tablet TAKE 1 TABLET BY MOUTH EVERY DAY 12/26/22  Yes Little Ishikawa, MD  aspirin EC 81 MG tablet Take 1 tablet (81 mg total) by mouth daily. Swallow whole. 02/01/20  Yes Little Ishikawa, MD  atorvastatin (LIPITOR) 80 MG tablet Take 1 tablet (80 mg total) by mouth daily. 11/07/22  Yes Dettinger, Elige Radon, MD  buPROPion (WELLBUTRIN XL) 300 MG 24 hr tablet  Take 1 tablet (300 mg total) by mouth daily. 11/07/22  Yes Dettinger, Elige Radon, MD  carvedilol (COREG) 12.5 MG tablet Take 1 tablet (12.5 mg total) by mouth 2 (two) times daily. 11/07/22  Yes Dettinger, Elige Radon, MD  hydrochlorothiazide (HYDRODIURIL) 25 MG tablet Take 1 tablet (25 mg total) by mouth daily. 11/07/22  Yes Dettinger, Elige Radon, MD  ramipril (ALTACE) 10 MG capsule TAKE 1 CAPSULE BY MOUTH TWICE A DAY 03/18/23  Yes Little Ishikawa, MD  spironolactone (ALDACTONE) 25 MG tablet TAKE 1 TABLET (25 MG TOTAL) BY MOUTH DAILY. 12/24/22  Yes Little Ishikawa, MD  nitroGLYCERIN (NITROSTAT) 0.4 MG SL tablet Place 0.4 mg under the tongue every 5 (five) minutes as needed for chest pain.    [provider]    Current Outpatient Medications  Medication Sig Dispense Refill   amLODipine (NORVASC) 10 MG tablet TAKE 1 TABLET BY MOUTH EVERY DAY 90 tablet 3   aspirin EC 81 MG tablet Take 1 tablet (81 mg total) by mouth daily. Swallow whole. 90 tablet 3   atorvastatin (LIPITOR) 80 MG tablet Take 1 tablet (80 mg total) by mouth daily. 90 tablet 3   buPROPion (WELLBUTRIN XL) 300 MG 24 hr tablet Take 1 tablet (300 mg total) by mouth daily. 90 tablet 3   carvedilol (COREG) 12.5 MG tablet Take 1 tablet (12.5 mg total) by mouth 2 (two) times daily. 180 tablet 3   hydrochlorothiazide (HYDRODIURIL) 25 MG tablet Take 1 tablet (25 mg  total) by mouth daily. 90 tablet 3   ramipril (ALTACE) 10 MG capsule TAKE 1 CAPSULE BY MOUTH TWICE A DAY 180 capsule 2   spironolactone (ALDACTONE) 25 MG tablet TAKE 1 TABLET (25 MG TOTAL) BY MOUTH DAILY. 90 tablet 3   nitroGLYCERIN (NITROSTAT) 0.4 MG SL tablet Place 0.4 mg under the tongue every 5 (five) minutes as needed for chest pain.     Current Facility-Administered Medications  Medication Dose Route Frequency Provider Last Rate Last Admin   0.9 %  sodium chloride infusion  500 mL Intravenous Once Napoleon Form, MD        Allergies as of 04/24/2023    (No Known Allergies)    Family History  Problem Relation Age of Onset   Heart disease Father    Stomach cancer Maternal Grandmother    Colon cancer Neg Hx    Rectal cancer Neg Hx    Esophageal cancer Neg Hx     Social History   Socioeconomic History   Marital status: Married    Spouse name: Not on file   Number of children: Not on file   Years of education: Not on file   Highest education level: Not on file  Occupational History   Not on file  Tobacco Use   Smoking status: Never   Smokeless tobacco: Never  Vaping Use   Vaping status: Never Used  Substance and Sexual Activity   Alcohol use: Yes    Alcohol/week: 12.0 standard drinks of alcohol    Types: 12 Cans of beer per week    Comment: occas.   Drug use: No   Sexual activity: Yes  Other Topics Concern   Not on file  Social History Narrative   Not on file   Social Drivers of Health   Financial Resource Strain: Not on file  Food Insecurity: Not on file  Transportation Needs: Not on file  Physical Activity: Not on file  Stress: Not on file  Social Connections: Unknown (06/18/2021)   Received from [facility]   Social Network    Social Network: Not on file  Intimate Partner Violence: Unknown (05/10/2021)   Received from [facility]   HITS    Physically Hurt: Not on file    Insult or Talk Down To: Not on file    Threaten Physical Harm: Not on file    Scream or Curse: Not on file    Review of Systems:  All other review of systems negative except as mentioned in the HPI.  Physical Exam: Vital signs in last 24 hours: BP 124/86   Pulse 84   Temp 98 F (36.7 C) (Temporal)   Ht 6\' 3"  (1.905 m)   Wt (!) 315 lb (142.9 kg)   SpO2 94%   BMI 39.37 kg/m  General:   Alert, NAD Lungs:  Clear .   Heart:  Regular rate and rhythm Abdomen:  Soft, nontender and nondistended. Neuro/Psych:  Alert and cooperative. Normal mood and affect. A and O x 3  Reviewed labs, radiology  imaging, old records and pertinent past GI work up  Patient is appropriate for planned procedure(s) and anesthesia in an ambulatory setting   K. Scherry Ran , MD [phone]

## 2023-04-24 NOTE — Op Note (Signed)
 [facility] Patient Name: Jonathan Barrett Procedure Date: 04/24/2023 11:55 AM MRN: [id number] Endoscopist: Napoleon Form , MD, [id number] Age: 58 Referring MD:  Date of Birth: 03-09-1965 Gender: Male Account #: [id number] Procedure:                Colonoscopy Indications:              Screening for colorectal malignant neoplasm Medicines:                Monitored Anesthesia Care Procedure:                Pre-Anesthesia Assessment:                           - Prior to the procedure, a History and Physical                            was performed, and patient medications and                            allergies were reviewed. The patient's tolerance of                            previous anesthesia was also reviewed. The risks                            and benefits of the procedure and the sedation                            options and risks were discussed with the patient.                            All questions were answered, and informed consent                            was obtained. Prior Anticoagulants: The patient has                            taken no anticoagulant or antiplatelet agents. ASA                            Grade Assessment: III - A patient with severe                            systemic disease. After reviewing the risks and                            benefits, the patient was deemed in satisfactory                            condition to undergo the procedure.                           After obtaining informed consent, the colonoscope  was passed under direct vision. Throughout the                            procedure, the patient's blood pressure, pulse, and                            oxygen saturations were monitored continuously. The                            Olympus Scope [id number] was introduced through the                            anus and advanced to the the cecum, identified by                             appendiceal orifice and ileocecal valve. The                            colonoscopy was performed without difficulty. The                            patient tolerated the procedure well. The quality                            of the bowel preparation was good. The ileocecal                            valve, appendiceal orifice, and rectum were                            photographed. Scope In: 12:03:15 PM Scope Out: 12:21:35 PM Scope Withdrawal Time: 0 hours 14 minutes 55 seconds  Total Procedure Duration: 0 hours 18 minutes 20 seconds  Findings:                 The perianal and digital rectal examinations were                            normal.                           Two sessile polyps were found in the ascending                            colon and cecum. The polyps were 1 to 2 mm in size.                            These polyps were removed with a cold biopsy                            forceps. Resection and retrieval were complete.                           Eight sessile polyps were found in the rectum,  descending colon and transverse colon. The polyps                            were 7 to 12 mm in size. These polyps were removed                            with a cold snare. Resection and retrieval were                            complete.                           Scattered small-mouthed diverticula were found in                            the sigmoid colon and descending colon.                           Non-bleeding external and internal hemorrhoids were                            found during retroflexion. The hemorrhoids were                            medium-sized. Complications:            No immediate complications. Estimated Blood Loss:     Estimated blood loss was minimal. Impression:               - Two 1 to 2 mm polyps in the ascending colon and                            in the cecum, removed with a cold biopsy forceps.                             Resected and retrieved.                           - Eight 7 to 12 mm polyps in the rectum, in the                            descending colon and in the transverse colon,                            removed with a cold snare. Resected and retrieved.                           - Diverticulosis in the sigmoid colon and in the                            descending colon.                           - Non-bleeding external and internal hemorrhoids. Recommendation:           -  Patient has a contact number available for                            emergencies. The signs and symptoms of potential                            delayed complications were discussed with the                            patient. Return to normal activities tomorrow.                            Written discharge instructions were provided to the                            patient.                           - Resume previous diet.                           - Continue present medications.                           - Await pathology results.                           - Repeat colonoscopy in 1 year for surveillance                            based on pathology results. Napoleon Form, MD 04/24/2023 12:29:10 PM This report has been signed electronically.

## 2023-04-24 NOTE — Progress Notes (Signed)
 Called to room to assist during endoscopic procedure.  Patient ID and intended procedure confirmed with present staff. Received instructions for my participation in the procedure from the performing physician.

## 2023-04-24 NOTE — Patient Instructions (Addendum)
 Repeat colonoscopy in 1 year.  Resume previous diet Continue present medications Await pathology results  Handouts/information given for polyps, diverticulosis and hemorrhoids   YOU HAD AN ENDOSCOPIC PROCEDURE TODAY AT THE [facility]:   Refer to the procedure report that was given to you for any specific questions about what was found during the examination.  If the procedure report does not answer your questions, please call your gastroenterologist to clarify.  If you requested that your care partner not be given the details of your procedure findings, then the procedure report has been included in a sealed envelope for you to review at your convenience later.  YOU SHOULD EXPECT: Some feelings of bloating in the abdomen. Passage of more gas than usual.  Walking can help get rid of the air that was put into your GI tract during the procedure and reduce the bloating. If you had a lower endoscopy (such as a colonoscopy or flexible sigmoidoscopy) you may notice spotting of blood in your stool or on the toilet paper. If you underwent a bowel prep for your procedure, you may not have a normal bowel movement for a few days.  Please Note:  You might notice some irritation and congestion in your nose or some drainage.  This is from the oxygen used during your procedure.  There is no need for concern and it should clear up in a day or so.  SYMPTOMS TO REPORT IMMEDIATELY:  Following lower endoscopy (colonoscopy or flexible sigmoidoscopy):  Excessive amounts of blood in the stool  Significant tenderness or worsening of abdominal pains  Swelling of the abdomen that is new, acute  Fever of 100F or higher  For urgent or emergent issues, a gastroenterologist can be reached at any hour by calling [phone]. Do not use MyChart messaging for urgent concerns.    DIET:  We do recommend a small meal at first, but then you may proceed to your regular diet.  Drink plenty of fluids but you  should avoid alcoholic beverages for 24 hours.  ACTIVITY:  You should plan to take it easy for the rest of today and you should NOT DRIVE or use heavy machinery until tomorrow (because of the sedation medicines used during the test).    FOLLOW UP: Our staff will call the number listed on your records the next business day following your procedure.  We will call around 7:15- 8:00 am to check on you and address any questions or concerns that you may have regarding the information given to you following your procedure. If we do not reach you, we will leave a message.     If any biopsies were taken you will be contacted by phone or by letter within the next 1-3 weeks.  Please call us at [phone] if you have not heard about the biopsies in 3 weeks.    SIGNATURES/CONFIDENTIALITY: You and/or your care partner have signed paperwork which will be entered into your electronic medical record.  These signatures attest to the fact that that the information above on your After Visit Summary has been reviewed and is understood.  Full responsibility of the confidentiality of this discharge information lies with you and/or your care-partner.

## 2023-04-24 NOTE — Progress Notes (Signed)
 Pt sedate, gd SR's, VSS, report to RN

## 2023-04-27 ENCOUNTER — Telehealth: Payer: Self-pay

## 2023-04-27 NOTE — Telephone Encounter (Signed)
 Follow up call to pt, lm for pt to call if having any difficulty with normal activities or eating and drinking.  Also to call if any other questions or concerns.

## 2023-04-28 LAB — SURGICAL PATHOLOGY

## 2023-06-16 ENCOUNTER — Ambulatory Visit: Payer: Self-pay | Admitting: Gastroenterology

## 2023-10-13 ENCOUNTER — Other Ambulatory Visit: Payer: Self-pay | Admitting: Cardiology

## 2023-12-09 ENCOUNTER — Other Ambulatory Visit: Payer: Self-pay | Admitting: *Deleted

## 2023-12-17 ENCOUNTER — Other Ambulatory Visit: Payer: Self-pay | Admitting: Cardiology

## 2023-12-29 ENCOUNTER — Other Ambulatory Visit: Payer: Self-pay | Admitting: Cardiology

## 2024-01-14 ENCOUNTER — Other Ambulatory Visit: Payer: Self-pay | Admitting: Family Medicine

## 2024-01-14 MED ORDER — CARVEDILOL 12.5 MG PO TABS: 12.5000 mg | ORAL_TABLET | Freq: Two times a day (BID) | ORAL | 0 refills | Status: DC

## 2024-01-14 NOTE — Telephone Encounter (Signed)
 Dettinger pt NTBS 30-d given 12/09/23

## 2024-01-14 NOTE — Addendum Note (Signed)
 Addended by: Lorell Thibodaux D on: 01/14/2024 12:58 PM   Modules accepted: Orders

## 2024-01-14 NOTE — Telephone Encounter (Signed)
 Pt scheduled appt for Jan 21. Please send enough meds to last to appt

## 2024-01-17 ENCOUNTER — Other Ambulatory Visit: Payer: Self-pay | Admitting: Family Medicine

## 2024-01-17 ENCOUNTER — Other Ambulatory Visit: Payer: Self-pay | Admitting: Cardiology

## 2024-01-17 DIAGNOSIS — F411 Generalized anxiety disorder: Secondary | ICD-10-CM

## 2024-02-01 ENCOUNTER — Other Ambulatory Visit: Payer: Self-pay | Admitting: Cardiology

## 2024-02-10 ENCOUNTER — Other Ambulatory Visit: Payer: Self-pay | Admitting: Cardiology

## 2024-02-11 NOTE — Telephone Encounter (Signed)
 Pt of Dr. Kate. Passed his 3rd attempt. Does Dr. Kate want to refill? Please advise.

## 2024-02-26 ENCOUNTER — Ambulatory Visit: Admitting: Family Medicine

## 2024-02-26 ENCOUNTER — Encounter: Payer: Self-pay | Admitting: Family Medicine

## 2024-02-26 VITALS — BP 129/86 | HR 89 | Ht 75.0 in | Wt 309.0 lb

## 2024-02-26 DIAGNOSIS — E66812 Obesity, class 2: Secondary | ICD-10-CM | POA: Diagnosis not present

## 2024-02-26 DIAGNOSIS — Z125 Encounter for screening for malignant neoplasm of prostate: Secondary | ICD-10-CM

## 2024-02-26 DIAGNOSIS — E782 Mixed hyperlipidemia: Secondary | ICD-10-CM

## 2024-02-26 DIAGNOSIS — I10 Essential (primary) hypertension: Secondary | ICD-10-CM

## 2024-02-26 DIAGNOSIS — F411 Generalized anxiety disorder: Secondary | ICD-10-CM | POA: Diagnosis not present

## 2024-02-26 MED ORDER — WEGOVY 0.25 MG/0.5ML ~~LOC~~ SOAJ: 0.2500 mg | SUBCUTANEOUS | 0 refills | Status: AC

## 2024-02-26 MED ORDER — HYDROCHLOROTHIAZIDE 25 MG PO TABS: 25.0000 mg | ORAL_TABLET | Freq: Every day | ORAL | 3 refills | Status: AC

## 2024-02-26 MED ORDER — ATORVASTATIN CALCIUM 80 MG PO TABS: 80.0000 mg | ORAL_TABLET | Freq: Every day | ORAL | 3 refills | Status: AC

## 2024-02-26 MED ORDER — NITROGLYCERIN 0.4 MG SL SUBL: 0.4000 mg | SUBLINGUAL_TABLET | SUBLINGUAL | 1 refills | Status: AC | PRN

## 2024-02-26 MED ORDER — CARVEDILOL 12.5 MG PO TABS: 12.5000 mg | ORAL_TABLET | Freq: Two times a day (BID) | ORAL | 3 refills | Status: AC

## 2024-02-26 MED ORDER — WEGOVY 1 MG/0.5ML ~~LOC~~ SOAJ: 1.0000 mg | SUBCUTANEOUS | 0 refills | Status: AC

## 2024-02-26 MED ORDER — BUPROPION HCL ER (XL) 300 MG PO TB24: 300.0000 mg | ORAL_TABLET | Freq: Every day | ORAL | 3 refills | Status: AC

## 2024-02-26 MED ORDER — WEGOVY 2.4 MG/0.75ML ~~LOC~~ SOAJ: 2.4000 mg | SUBCUTANEOUS | 0 refills | Status: AC

## 2024-02-26 MED ORDER — WEGOVY 1.7 MG/0.75ML ~~LOC~~ SOAJ: 1.7000 mg | SUBCUTANEOUS | 0 refills | Status: AC

## 2024-02-26 MED ORDER — WEGOVY 0.5 MG/0.5ML ~~LOC~~ SOAJ: 0.5000 mg | SUBCUTANEOUS | 0 refills | Status: AC

## 2024-02-26 NOTE — Progress Notes (Signed)
 "  BP 129/86   Pulse 89   Ht 6' 3 (1.905 m)   Wt (!) 309 lb (140.2 kg)   SpO2 96%   BMI 38.62 kg/m    Subjective:   Patient ID: Jonathan Barrett, male    DOB: 16-May-1965, 59 y.o.   MRN: [id number]  HPI: Jonathan Barrett is a 59 y.o. male presenting on 02/26/2024 for Medical Management of Chronic Issues, Hyperlipidemia, and Hypertension   Discussed the use of AI scribe software for clinical note transcription with the patient, who gave verbal consent to proceed.  History of Present Illness   Jonathan Barrett is a 59 year old male with hypertension and coronary artery disease who presents for a recheck of his conditions.  Hypertension and coronary artery disease - Blood pressure well-controlled with amlodipine , carvedilol , hydrochlorothiazide , and ramipril  for several years - No recent angina or chest pain - No use of nitroglycerin  in the past four to five years; uncertain if current supply is still effective - On atorvastatin  for cholesterol management without adverse effects  Anxiety - Stable on Wellbutrin  - No recent anxiety symptoms  Recent viral illness - Recent episode of chest congestion and persistent cough - Symptoms have resolved  Weight management - Interested in weight loss medications, including injectable and oral options - Uncertain about insurance coverage and costs for weight loss medications - Using Zyn pouches, covered by flexible spending account          Relevant past medical, surgical, family and social history reviewed and updated as indicated. Interim medical history since our last visit reviewed. Allergies and medications reviewed and updated.  Review of Systems  Constitutional:  Negative for chills and fever.  HENT:  Positive for congestion.   Eyes:  Negative for visual disturbance.  Respiratory:  Negative for shortness of breath and wheezing.   Cardiovascular:  Negative for chest pain and leg swelling.  Musculoskeletal:  Negative for back pain and  gait problem.  Skin:  Negative for rash.  Neurological:  Negative for dizziness and light-headedness.  Psychiatric/Behavioral:  Negative for dysphoric mood and sleep disturbance. The patient is not nervous/anxious.   All other systems reviewed and are negative.   Per HPI unless specifically indicated above   Allergies as of 02/26/2024   No Known Allergies      Medication List        Accurate as of February 26, 2024  3:24 PM. If you have any questions, ask your nurse or doctor.          amLODipine  10 MG tablet Commonly known as: NORVASC  Take 1 tablet (10 mg total) by mouth daily. PATIENT MUST MAKE AN APPOINTMENT IN ORDER TO RECEIVE ADDITIONAL REFILLS, FIRST ATTEMPT   aspirin  EC 81 MG tablet Take 1 tablet (81 mg total) by mouth daily. Swallow whole.   atorvastatin  80 MG tablet Commonly known as: LIPITOR Take 1 tablet (80 mg total) by mouth daily.   buPROPion  300 MG 24 hr tablet Commonly known as: WELLBUTRIN  XL Take 1 tablet (300 mg total) by mouth daily.   carvedilol  12.5 MG tablet Commonly known as: COREG  Take 1 tablet (12.5 mg total) by mouth 2 (two) times daily.   hydrochlorothiazide  25 MG tablet Commonly known as: HYDRODIURIL  Take 1 tablet (25 mg total) by mouth daily.   nitroGLYCERIN  0.4 MG SL tablet Commonly known as: NITROSTAT  Place 1 tablet (0.4 mg total) under the tongue every 5 (five) minutes as needed for chest pain.   ramipril  10 MG capsule  Commonly known as: ALTACE  TAKE 1 CAPSULE BY MOUTH TWICE A DAY   spironolactone  25 MG tablet Commonly known as: ALDACTONE  TAKE 1 TABLET (25 MG TOTAL) BY MOUTH DAILY.   Wegovy  0.25 MG/0.5ML Soaj SQ injection Generic drug: semaglutide -weight management Inject 0.25 mg into the skin once a week. Started by: Fonda Levins, MD   Wegovy  0.5 MG/0.5ML Soaj SQ injection Generic drug: semaglutide -weight management Inject 0.5 mg into the skin once a week. Started by: Fonda Levins, MD   Wegovy  1 MG/0.5ML Soaj  SQ injection Generic drug: semaglutide -weight management Inject 1 mg into the skin once a week. Started by: Fonda Levins, MD   Wegovy  1.7 MG/0.75ML Soaj SQ injection Generic drug: semaglutide -weight management Inject 1.7 mg into the skin once a week. Started by: Fonda Levins, MD   Wegovy  2.4 MG/0.75ML Soaj SQ injection Generic drug: semaglutide -weight management Inject 2.4 mg into the skin once a week. Started by: Fonda Levins, MD         Objective:   BP 129/86   Pulse 89   Ht 6' 3 (1.905 m)   Wt (!) 309 lb (140.2 kg)   SpO2 96%   BMI 38.62 kg/m   Wt Readings from Last 3 Encounters:  02/26/24 (!) 309 lb (140.2 kg)  04/24/23 (!) 315 lb (142.9 kg)  03/27/23 (!) 315 lb (142.9 kg)    Physical Exam Physical Exam   VITALS: BP- 129/86 HEENT: Ears normal. NECK: Thyroid normal. CHEST: Lungs clear to auscultation bilaterally. CARDIOVASCULAR: Heart regular rate and rhythm. EXTREMITIES: No extremity swelling, good pulses.         Assessment & Plan:   Problem List Items Addressed This Visit       Cardiovascular and Mediastinum   Hypertension - Primary   Relevant Medications   atorvastatin  (LIPITOR) 80 MG tablet   carvedilol  (COREG ) 12.5 MG tablet   hydrochlorothiazide  (HYDRODIURIL ) 25 MG tablet   nitroGLYCERIN  (NITROSTAT ) 0.4 MG SL tablet   Other Relevant Orders   CBC with Differential/Platelet   CMP14+EGFR   Lipid panel   PSA, total and free   TSH     Other   Hyperlipidemia   Relevant Medications   atorvastatin  (LIPITOR) 80 MG tablet   carvedilol  (COREG ) 12.5 MG tablet   hydrochlorothiazide  (HYDRODIURIL ) 25 MG tablet   nitroGLYCERIN  (NITROSTAT ) 0.4 MG SL tablet   Other Relevant Orders   CBC with Differential/Platelet   CMP14+EGFR   Lipid panel   PSA, total and free   TSH   GAD (generalized anxiety disorder)   Relevant Medications   buPROPion  (WELLBUTRIN  XL) 300 MG 24 hr tablet   Other Relevant Orders   CBC with Differential/Platelet    CMP14+EGFR   Lipid panel   PSA, total and free   TSH   Morbid obesity (HCC)   Relevant Medications   semaglutide -weight management (WEGOVY ) 0.25 MG/0.5ML SOAJ SQ injection   semaglutide -weight management (WEGOVY ) 0.5 MG/0.5ML SOAJ SQ injection   semaglutide -weight management (WEGOVY ) 1 MG/0.5ML SOAJ SQ injection   semaglutide -weight management (WEGOVY ) 1.7 MG/0.75ML SOAJ SQ injection   semaglutide -weight management (WEGOVY ) 2.4 MG/0.75ML SOAJ SQ injection   Other Visit Diagnoses       Prostate cancer screening       Relevant Orders   CBC with Differential/Platelet   CMP14+EGFR   Lipid panel   PSA, total and free   TSH          Primary hypertension Blood pressure controlled at 129/86 mmHg with current medications. - Continue amlodipine , carvedilol ,  hydrochlorothiazide , and ramipril .  Mixed hyperlipidemia Stable on atorvastatin  without issues. - Continue atorvastatin .  Generalized anxiety disorder Anxiety well-managed with Wellbutrin . - Continue Wellbutrin .        Patient's BMI is >30 mg/m2.  Patient's current BMI is Body mass index is 38.62 kg/m.SABRA   Patient is currently enrolled in a healthy eating plan along with encouraged exercise.   Patient does not have a personal or family history of medullary thyroid carcinoma (MTC) or Multiple Endocrine Neoplasia syndrome type 2 (MEN 2).   Follow up plan: Return in about 6 months (around 08/25/2024), or if symptoms worsen or fail to improve, for Physical and hypertension and hyperlipidemia.  Counseling provided for all of the vaccine components Orders Placed This Encounter  Procedures   CBC with Differential/Platelet   CMP14+EGFR   Lipid panel   PSA, total and free   TSH    Fonda Levins, MD [facility] 02/26/2024, 3:24 PM     "

## 2024-02-27 LAB — LIPID PANEL
Chol/HDL Ratio: 3.3 ratio (ref 0.0–5.0)
Cholesterol, Total: 133 mg/dL (ref 100–199)
HDL: 40 mg/dL
LDL Chol Calc (NIH): 71 mg/dL (ref 0–99)
Triglycerides: 122 mg/dL (ref 0–149)
VLDL Cholesterol Cal: 22 mg/dL (ref 5–40)

## 2024-02-27 LAB — CBC WITH DIFFERENTIAL/PLATELET
Basophils Absolute: 0.1 10*3/uL (ref 0.0–0.2)
Basos: 1 %
EOS (ABSOLUTE): 0.4 10*3/uL (ref 0.0–0.4)
Eos: 3 %
Hematocrit: 45.1 % (ref 37.5–51.0)
Hemoglobin: 14.8 g/dL (ref 13.0–17.7)
Immature Grans (Abs): 0.1 10*3/uL (ref 0.0–0.1)
Immature Granulocytes: 1 %
Lymphocytes Absolute: 3 10*3/uL (ref 0.7–3.1)
Lymphs: 22 %
MCH: 30.8 pg (ref 26.6–33.0)
MCHC: 32.8 g/dL (ref 31.5–35.7)
MCV: 94 fL (ref 79–97)
Monocytes Absolute: 1.2 10*3/uL — ABNORMAL HIGH (ref 0.1–0.9)
Monocytes: 9 %
Neutrophils Absolute: 8.9 10*3/uL — ABNORMAL HIGH (ref 1.4–7.0)
Neutrophils: 64 %
Platelets: 291 10*3/uL (ref 150–450)
RBC: 4.81 x10E6/uL (ref 4.14–5.80)
RDW: 12.2 % (ref 11.6–15.4)
WBC: 13.7 10*3/uL — ABNORMAL HIGH (ref 3.4–10.8)

## 2024-02-27 LAB — CMP14+EGFR
ALT: 31 [IU]/L (ref 0–44)
AST: 19 [IU]/L (ref 0–40)
Albumin: 4.2 g/dL (ref 3.8–4.9)
Alkaline Phosphatase: 81 [IU]/L (ref 47–123)
BUN/Creatinine Ratio: 15 (ref 9–20)
BUN: 16 mg/dL (ref 6–24)
Bilirubin Total: 0.4 mg/dL (ref 0.0–1.2)
CO2: 22 mmol/L (ref 20–29)
Calcium: 9.6 mg/dL (ref 8.7–10.2)
Chloride: 98 mmol/L (ref 96–106)
Creatinine, Ser: 1.09 mg/dL (ref 0.76–1.27)
Globulin, Total: 3 g/dL (ref 1.5–4.5)
Glucose: 87 mg/dL (ref 70–99)
Potassium: 4.6 mmol/L (ref 3.5–5.2)
Sodium: 134 mmol/L (ref 134–144)
Total Protein: 7.2 g/dL (ref 6.0–8.5)
eGFR: 78 mL/min/{1.73_m2}

## 2024-02-27 LAB — PSA, TOTAL AND FREE
PSA, Free Pct: 7.2 %
PSA, Free: 0.18 ng/mL
Prostate Specific Ag, Serum: 2.5 ng/mL (ref 0.0–4.0)

## 2024-02-27 LAB — TSH: TSH: 0.799 u[IU]/mL (ref 0.450–4.500)

## 2024-03-07 ENCOUNTER — Ambulatory Visit: Payer: Self-pay | Admitting: Family Medicine

## 2024-09-07 ENCOUNTER — Encounter: Admitting: Family Medicine
